# Patient Record
Sex: Female | Born: 1980 | Race: White | Hispanic: Yes | Marital: Married | Smoking: Never smoker
Health system: Southern US, Community
[De-identification: ages and names within clinical notes are randomized; demographics above are authoritative.]

## PROBLEM LIST (undated history)

## (undated) DIAGNOSIS — F32A Depression, unspecified: Secondary | ICD-10-CM

## (undated) DIAGNOSIS — F419 Anxiety disorder, unspecified: Secondary | ICD-10-CM

## (undated) HISTORY — PX: ECTOPIC PREGNANCY SURGERY: SHX613

## (undated) HISTORY — PX: OTHER SURGICAL HISTORY: SHX169

## (undated) HISTORY — PX: APPENDECTOMY: SHX54

## (undated) HISTORY — PX: TUBAL LIGATION: SHX77

## (undated) HISTORY — PX: VAGINAL HYSTERECTOMY: SUR661

---

## 1999-04-16 ENCOUNTER — Other Ambulatory Visit: Admission: RE | Admit: 1999-04-16 | Discharge: 1999-04-16 | Payer: Self-pay | Admitting: Family Medicine

## 2001-09-15 ENCOUNTER — Other Ambulatory Visit: Admission: RE | Admit: 2001-09-15 | Discharge: 2001-09-15 | Payer: Self-pay | Admitting: Family Medicine

## 2001-12-08 ENCOUNTER — Other Ambulatory Visit: Admission: RE | Admit: 2001-12-08 | Discharge: 2001-12-08 | Payer: Self-pay | Admitting: Family Medicine

## 2005-03-27 ENCOUNTER — Other Ambulatory Visit: Admission: RE | Admit: 2005-03-27 | Discharge: 2005-03-27 | Payer: Self-pay | Admitting: Family Medicine

## 2005-03-27 ENCOUNTER — Ambulatory Visit: Payer: Self-pay | Admitting: Family Medicine

## 2015-09-19 ENCOUNTER — Emergency Department (HOSPITAL_COMMUNITY)
Admission: EM | Admit: 2015-09-19 | Discharge: 2015-09-19 | Disposition: A | Discharge status: Home / Self Care | Payer: BC Managed Care – PPO

## 2015-09-19 NOTE — ED Notes (Signed)
Pt does not want to wait.

## 2018-09-07 ENCOUNTER — Other Ambulatory Visit: Payer: Self-pay

## 2018-09-07 ENCOUNTER — Encounter: Payer: Self-pay | Admitting: Obstetrics & Gynecology

## 2018-09-07 ENCOUNTER — Ambulatory Visit: Payer: BC Managed Care – PPO | Admitting: Obstetrics & Gynecology

## 2018-09-07 VITALS — BP 126/74 | HR 76 | Ht 63.0 in | Wt 194.0 lb

## 2018-09-07 DIAGNOSIS — N920 Excessive and frequent menstruation with regular cycle: Secondary | ICD-10-CM | POA: Diagnosis not present

## 2018-09-07 DIAGNOSIS — N946 Dysmenorrhea, unspecified: Secondary | ICD-10-CM

## 2018-09-07 MED ORDER — MEGESTROL ACETATE 40 MG PO TABS: ORAL_TABLET | ORAL | 3 refills | Status: DC

## 2018-09-07 NOTE — Progress Notes (Signed)
Chief Complaint  Patient presents with  . Menorrhagia    wants ablation      38 y.o. [name] Patient's last menstrual period was 08/20/2018 (exact date). The current method of family planning is none.  Outpatient Encounter Medications as of 09/07/2018  Medication Sig  . ibuprofen (ADVIL,MOTRIN) 800 MG tablet   . sertraline (ZOLOFT) 50 MG tablet TAKE 1 & 1 2 (ONE & ONE HALF) TABLETS BY MOUTH ONCE DAILY  . valACYclovir (VALTREX) 1000 MG tablet Take 1,000 mg by mouth 3 (three) times daily.  . megestrol (MEGACE) 40 MG tablet 3 tablets a day for 5 days, 2 tablets a day for 5 days then 1 tablet daily   No facility-administered encounter medications on file as of 09/07/2018.     Subjective Pt has worsening menstrual periods over the past 2-3 years Last 5-7 days very heavy with clots and cramping Uses a whole box of super tampons per cycle +soils Hemoglobin 12.3 No dyspareunia and no pain at times other than with her cycle History reviewed. No pertinent past medical history.  Past Surgical History:  Procedure Laterality Date  . APPENDECTOMY    . CESAREAN SECTION WITH BILATERAL TUBAL LIGATION    . ECTOPIC PREGNANCY SURGERY    . reversal of tubal      OB History    Gravida  5   Para  4   Term  4   Preterm      AB  1   Living  4     SAB      TAB      Ectopic  1   Multiple      Live Births              No Known Allergies  Social History   Socioeconomic History  . Marital status: Married    Spouse name: Not on file  . Number of children: Not on file  . Years of education: Not on file  . Highest education level: Not on file  Occupational History  . Not on file  Social Needs  . Financial resource strain: Not on file  . Food insecurity:    Worry: Not on file    Inability: Not on file  . Transportation needs:    Medical: Not on file    Non-medical: Not on file  Tobacco Use  . Smoking status: Never Smoker  . Smokeless tobacco: Never Used    Substance and Sexual Activity  . Alcohol use: Not Currently  . Drug use: Not Currently  . Sexual activity: Yes    Birth control/protection: None  Lifestyle  . Physical activity:    Days per week: Not on file    Minutes per session: Not on file  . Stress: Not on file  Relationships  . Social connections:    Talks on phone: Not on file    Gets together: Not on file    Attends religious service: Not on file    Active member of club or organization: Not on file    Attends meetings of clubs or organizations: Not on file    Relationship status: Not on file  Other Topics Concern  . Not on file  Social History Narrative  . Not on file    History reviewed. No pertinent family history.  Medications:       Current Outpatient Medications:  .  ibuprofen (ADVIL,MOTRIN) 800 MG tablet, , Disp: , Rfl:  .  sertraline (ZOLOFT)  50 MG tablet, TAKE 1 & 1 2 (ONE & ONE HALF) TABLETS BY MOUTH ONCE DAILY, Disp: , Rfl:  .  valACYclovir (VALTREX) 1000 MG tablet, Take 1,000 mg by mouth 3 (three) times daily., Disp: , Rfl:  .  megestrol (MEGACE) 40 MG tablet, 3 tablets a day for 5 days, 2 tablets a day for 5 days then 1 tablet daily, Disp: 45 tablet, Rfl: 3  Objective Blood pressure 126/74, pulse 76, height 5\' 3"  (1.6 m), weight 194 lb (88 kg), last menstrual period 08/20/2018.  Gen WDWN NAD  Pertinent ROS No burning with urination, frequency or urgency No nausea, vomiting or diarrhea Nor fever chills or other constitutional symptoms   Labs or studies Hemoglobin 12.3    Impression Diagnoses this Encounter::   ICD-10-CM   1. Menorrhagia with regular cycle N92.0 US PELVIS (TRANSABDOMINAL ONLY)    US Transvaginal Non-OB  2. Dysmenorrhea N94.6 US PELVIS (TRANSABDOMINAL ONLY)    US Transvaginal Non-OB    Established relevant diagnosis(es):   Plan/Recommendations: Meds ordered this encounter  Medications  . megestrol (MEGACE) 40 MG tablet    Sig: 3 tablets a day for 5 days, 2 tablets a  day for 5 days then 1 tablet daily    Dispense:  45 tablet    Refill:  3    Labs or Scans Ordered: Orders Placed This Encounter  Procedures  . US PELVIS (TRANSABDOMINAL ONLY)  . US Transvaginal Non-OB    Management:: >megestrol trial for endometrial suppression >sonogram 1 month  Follow up Return in about 1 month (around 10/08/2018) for GYN sono, Follow up, with Dr Despina Hidden.        Face to face time:  20 minutes  Greater than 50% of the visit time was spent in counseling and coordination of care with the patient.  The summary and outline of the counseling and care coordination is summarized in the note above.   All questions were answered.

## 2018-10-08 ENCOUNTER — Other Ambulatory Visit: Payer: BC Managed Care – PPO

## 2020-02-07 ENCOUNTER — Other Ambulatory Visit: Payer: Self-pay

## 2020-02-07 ENCOUNTER — Ambulatory Visit
Admission: EM | Admit: 2020-02-07 | Discharge: 2020-02-07 | Disposition: A | Discharge status: Home / Self Care | Payer: Medicaid Other | Attending: Emergency Medicine | Admitting: Emergency Medicine

## 2020-02-07 ENCOUNTER — Ambulatory Visit (INDEPENDENT_AMBULATORY_CARE_PROVIDER_SITE_OTHER): Payer: Medicaid Other

## 2020-02-07 DIAGNOSIS — M25521 Pain in right elbow: Secondary | ICD-10-CM

## 2020-02-07 DIAGNOSIS — M778 Other enthesopathies, not elsewhere classified: Secondary | ICD-10-CM

## 2020-02-07 DIAGNOSIS — M5410 Radiculopathy, site unspecified: Secondary | ICD-10-CM | POA: Diagnosis not present

## 2020-02-07 DIAGNOSIS — K644 Residual hemorrhoidal skin tags: Secondary | ICD-10-CM | POA: Diagnosis not present

## 2020-02-07 DIAGNOSIS — M67824 Other specified disorders of tendon, left elbow: Secondary | ICD-10-CM

## 2020-02-07 MED ORDER — HYDROCORTISONE (PERIANAL) 2.5 % EX CREA: 1.0000 "application " | TOPICAL_CREAM | Freq: Two times a day (BID) | CUTANEOUS | 0 refills | Status: DC

## 2020-02-07 MED ORDER — BENZOCAINE 20 % RE OINT: TOPICAL_OINTMENT | RECTAL | 0 refills | Status: DC | PRN

## 2020-02-07 MED ORDER — DEXAMETHASONE SODIUM PHOSPHATE 10 MG/ML IJ SOLN
10.0000 mg | Freq: Once | INTRAMUSCULAR | Status: AC
  Administered 2020-02-07: 10 mg via INTRAMUSCULAR

## 2020-02-07 MED ORDER — GABAPENTIN 100 MG PO CAPS: 100.0000 mg | ORAL_CAPSULE | Freq: Three times a day (TID) | ORAL | 0 refills | Status: DC

## 2020-02-07 NOTE — Discharge Instructions (Addendum)
Gabapentin was prescribed for cervical radiculopathy Decadron IM was given for both tendinitis Anusol and  benzocaine were prescribed for hemorrhoid Follow RICE instruction that is attached Follow-up with PCP Return or go to ED for worsening symptoms

## 2020-02-07 NOTE — ED Provider Notes (Signed)
[facility]   [id number] 02/07/20 Arrival Time: 1621   Chief Complaint  Patient presents with   Joint Swelling   Hemorrhoids     SUBJECTIVE: History from: patient.  Patricia Patel is a 39 y.o. female who presented to the urgent care for complaint of right elbow pain for the past month.  Report tingling and numbness on the tip of finger. Denies any precipitating event, injury or trauma.  Localized pain to the right elbow.  She describes the pain as constant and achy.  She has tried OTC medications without relief.  Her symptoms are made worse with ROM.  She denies similar symptoms in the past.  Denies chills, fever, nausea, vomiting, diarrhea.  She is also complaining of hemorrhoid for the past few weeks.  Has tried OTC medication without relief.  Reports similar symptoms in the past that improved with benzocaine and Anusol.  Denies alleviating or aggravating factor.   ROS: As per HPI.  All other pertinent ROS negative.      No past medical history on file. Past Surgical History:  Procedure Laterality Date   APPENDECTOMY     CESAREAN SECTION WITH BILATERAL TUBAL LIGATION     ECTOPIC PREGNANCY SURGERY     reversal of tubal     No Known Allergies No current facility-administered medications on file prior to encounter.   Current Outpatient Medications on File Prior to Encounter  Medication Sig Dispense Refill   ibuprofen (ADVIL,MOTRIN) 800 MG tablet      megestrol (MEGACE) 40 MG tablet 3 tablets a day for 5 days, 2 tablets a day for 5 days then 1 tablet daily 45 tablet 3   sertraline (ZOLOFT) 50 MG tablet TAKE 1 & 1 2 (ONE & ONE HALF) TABLETS BY MOUTH ONCE DAILY     valACYclovir (VALTREX) 1000 MG tablet Take 1,000 mg by mouth 3 (three) times daily.     Social History   Socioeconomic History   Marital status: Married    Spouse name: Not on file   Number of children: Not on file   Years of education: Not on file   Highest education level: Not on file   Occupational History   Not on file  Tobacco Use   Smoking status: Never Smoker   Smokeless tobacco: Never Used  Vaping Use   Vaping Use: Never used  Substance and Sexual Activity   Alcohol use: Not Currently   Drug use: Not Currently   Sexual activity: Yes    Birth control/protection: None  Other Topics Concern   Not on file  Social History Narrative   Not on file   Social Determinants of Health   Financial Resource Strain:    Difficulty of Paying Living Expenses:   Food Insecurity:    Worried About Programme researcher, broadcasting/film/video in the Last Year:    Barista in the Last Year:   Transportation Needs:    Freight forwarder (Medical):    Lack of Transportation (Non-Medical):   Physical Activity:    Days of Exercise per Week:    Minutes of Exercise per Session:   Stress:    Feeling of Stress :   Social Connections:    Frequency of Communication with Friends and Family:    Frequency of Social Gatherings with Friends and Family:    Attends Religious Services:    Active Member of Clubs or Organizations:    Attends Banker Meetings:    Marital Status:  Intimate Partner Violence:    Fear of Current or Ex-Partner:    Emotionally Abused:    Physically Abused:    Sexually Abused:    No family history on file.  OBJECTIVE:  Vitals:   02/07/20 1711  BP: 118/77  Pulse: 78  Resp: 18  Temp: 97.9 F (36.6 C)  SpO2: 96%     Physical Exam Vitals and nursing note reviewed.  Constitutional:      General: She is not in acute distress.    Appearance: Normal appearance. She is normal weight. She is not ill-appearing, toxic-appearing or diaphoretic.  HENT:     Head: Normocephalic.  Cardiovascular:     Rate and Rhythm: Normal rate and regular rhythm.     Pulses: Normal pulses.     Heart sounds: Normal heart sounds. No murmur heard.  No friction rub. No gallop.   Pulmonary:     Effort: Pulmonary effort is normal. No respiratory  distress.     Breath sounds: Normal breath sounds. No stridor. No wheezing, rhonchi or rales.  Chest:     Chest wall: No tenderness.  Musculoskeletal:     Right elbow: Tenderness present.     Left elbow: Normal.     Comments: The right elbow is without obvious asymmetry or deformity compared to the left elbow.  No obvious surface trauma, ecchymosis, soft tissue swelling.  Bony tenderness to lateral epicondyle.  Normal muscle strength.  Neurovascular status intact.  Neurological:     Mental Status: She is alert and oriented to person, place, and time.     GCS: GCS eye subscore is 4. GCS verbal subscore is 5. GCS motor subscore is 6.     Cranial Nerves: Cranial nerves are intact.     Sensory: Sensory deficit present.     Motor: Motor function is intact.     Coordination: Coordination is intact.     Gait: Gait is intact.     LABS:  No results found for this or any previous visit (from the past 24 hour(s)).   Radiology DG Elbow Complete Right  Result Date: 02/07/2020 CLINICAL DATA:  Right elbow pain EXAM: RIGHT ELBOW - COMPLETE 3+ VIEW COMPARISON:  None. FINDINGS: There is no evidence of fracture, dislocation, or joint effusion. There is no evidence of arthropathy or other focal bone abnormality. Soft tissues are unremarkable. IMPRESSION: Negative. Electronically Signed   By: Charlett Nose M.D.   On: 02/07/2020 18:18   ASSESSMENT & PLAN:  1. Right elbow tendinitis   2. Hemorrhoids, external   3. Radiculopathy affecting upper extremity     No orders of the defined types were placed in this encounter.  Patient stable for discharge. Right elbow x-ray is negative for bony abnormality including fracture or dislocation.  I have reviewed the x-ray myself and the radiologist interpretation.  I am in agreement with the radiologist interpretation.  Was advised to take OTC Tylenol/ibuprofen as needed for pain.  Decadron IM was given in office.  Declines oral steroid.  Discharge  instructions Gabapentin was prescribed for cervical radiculopathy Decadron IM was given for both tendinitis Anusol and  benzocaine were prescribed for hemorrhoid Follow RICE instruction that is attached Follow-up with PCP Return or go to ED for worsening symptoms   Reviewed expectations re: course of current medical issues. Questions answered. Outlined signs and symptoms indicating need for more acute intervention. Patient verbalized understanding. After Visit Summary given.      Note: This document was prepared using Dragon voice  recognition software and may include unintentional dictation errors.    Durward Parcel, FNP 02/07/20 1842

## 2020-02-07 NOTE — ED Triage Notes (Signed)
Pt presents with right shoulder pain that developed a month ago, denies injury, pain now in elbow, sharp pain with extension , also reports hemorrhoids

## 2020-10-02 ENCOUNTER — Ambulatory Visit: Payer: Medicaid Other | Admitting: Urology

## 2020-10-02 DIAGNOSIS — N390 Urinary tract infection, site not specified: Secondary | ICD-10-CM

## 2021-09-06 IMAGING — DX DG ELBOW COMPLETE 3+V*R*
4 series · 4 of 4 positions shown · non-contrast
Comparison: None.

CLINICAL DATA: Right elbow pain

EXAM:
RIGHT ELBOW - COMPLETE 3+ VIEW

[elbow ap]
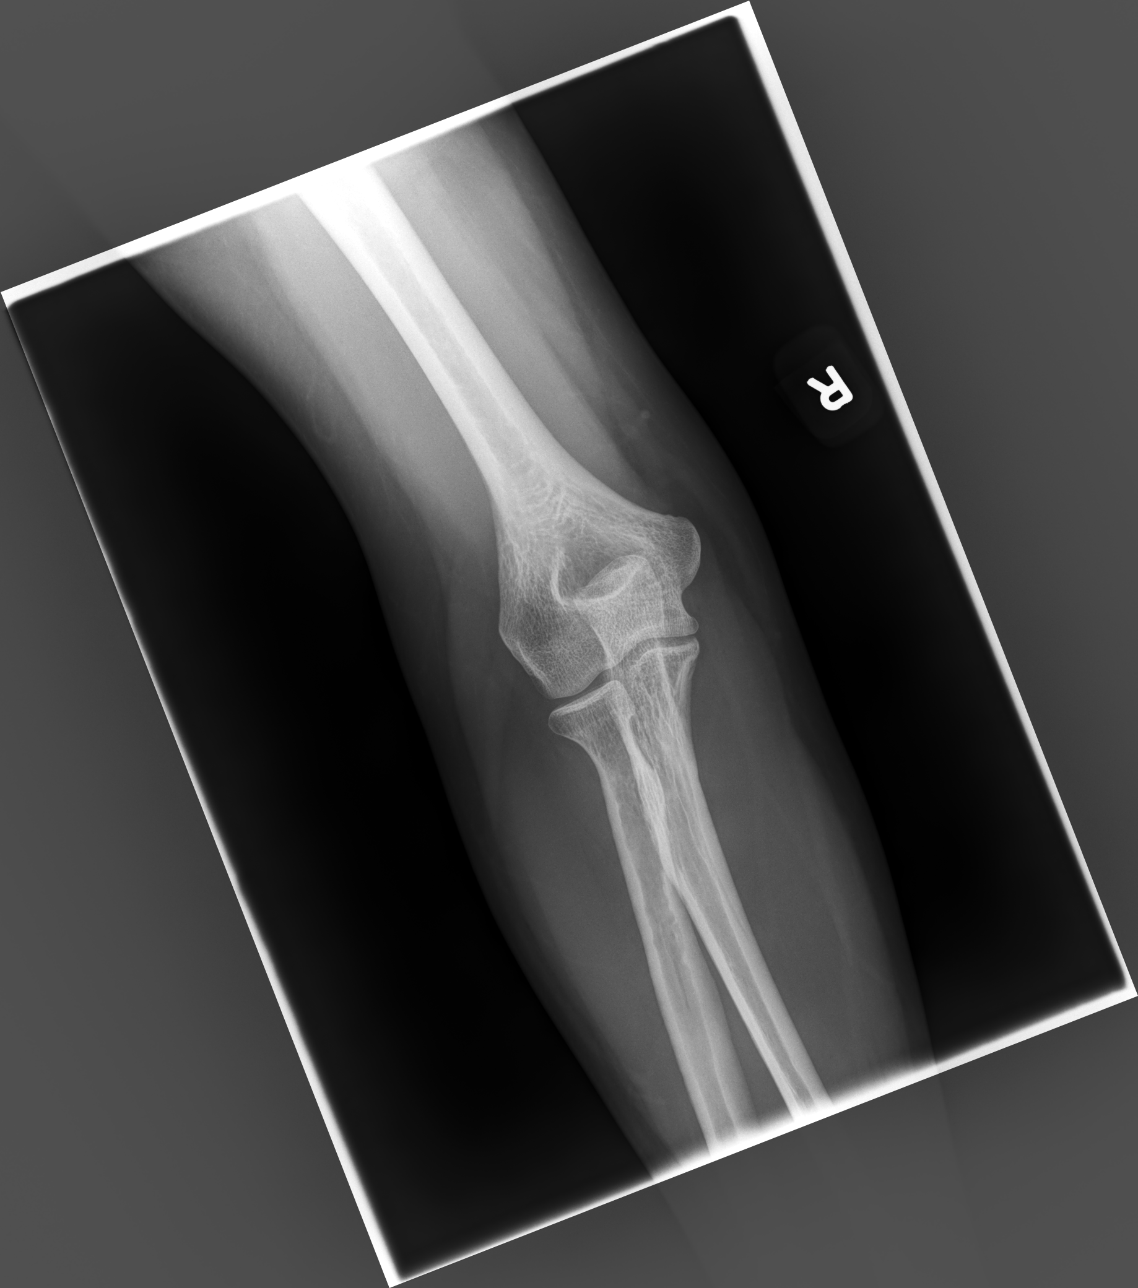

[elbow lmo (1 of 2)]
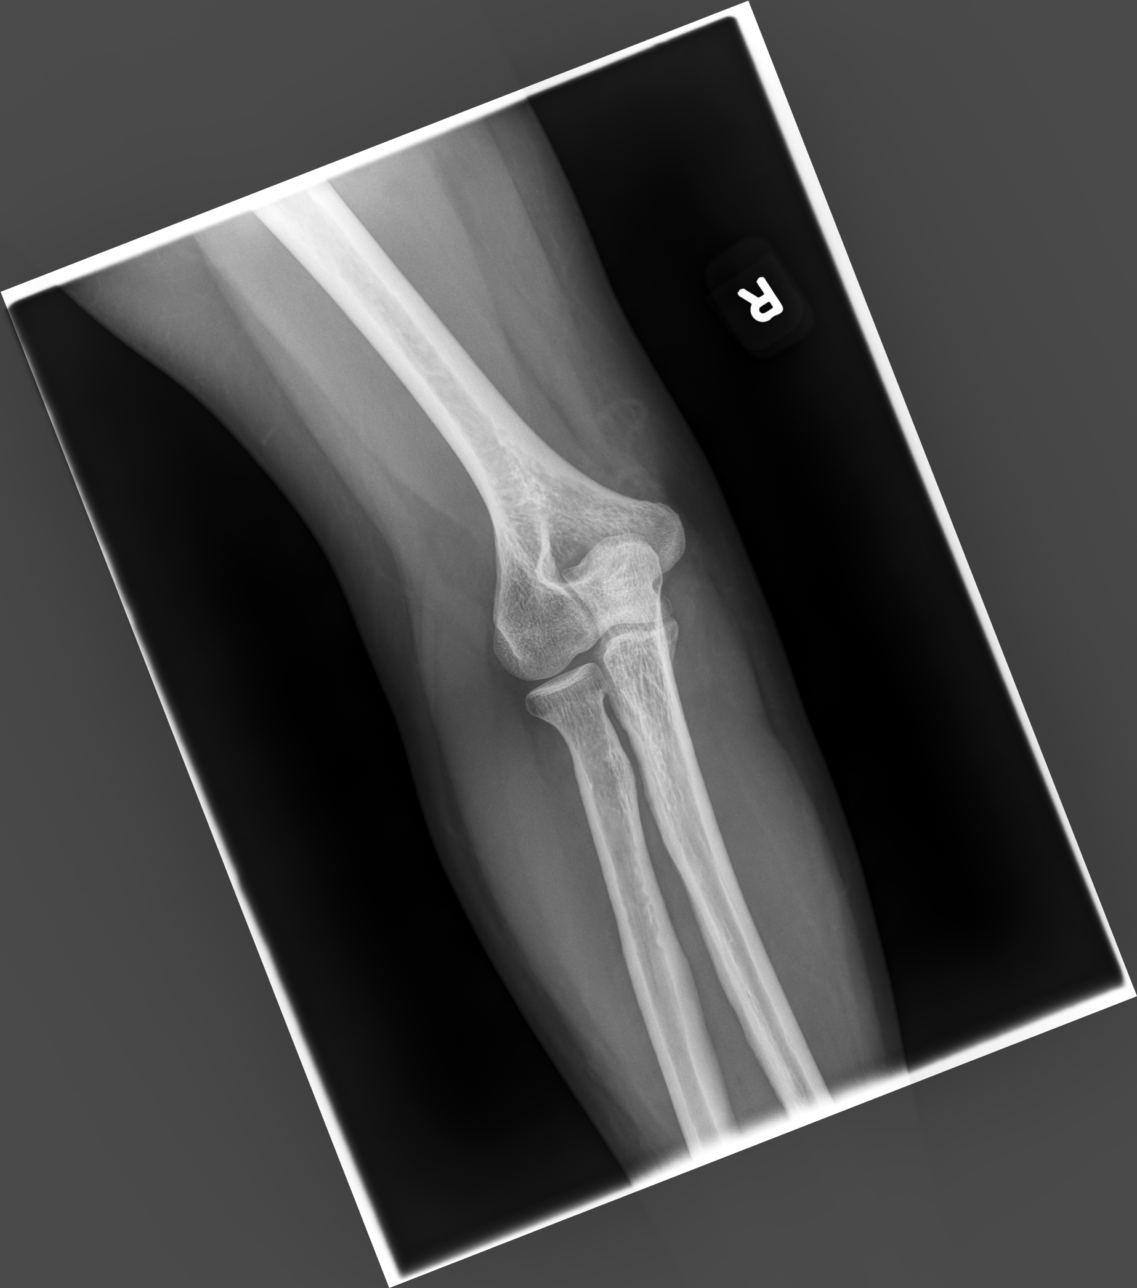

[elbow lmo (2 of 2)]
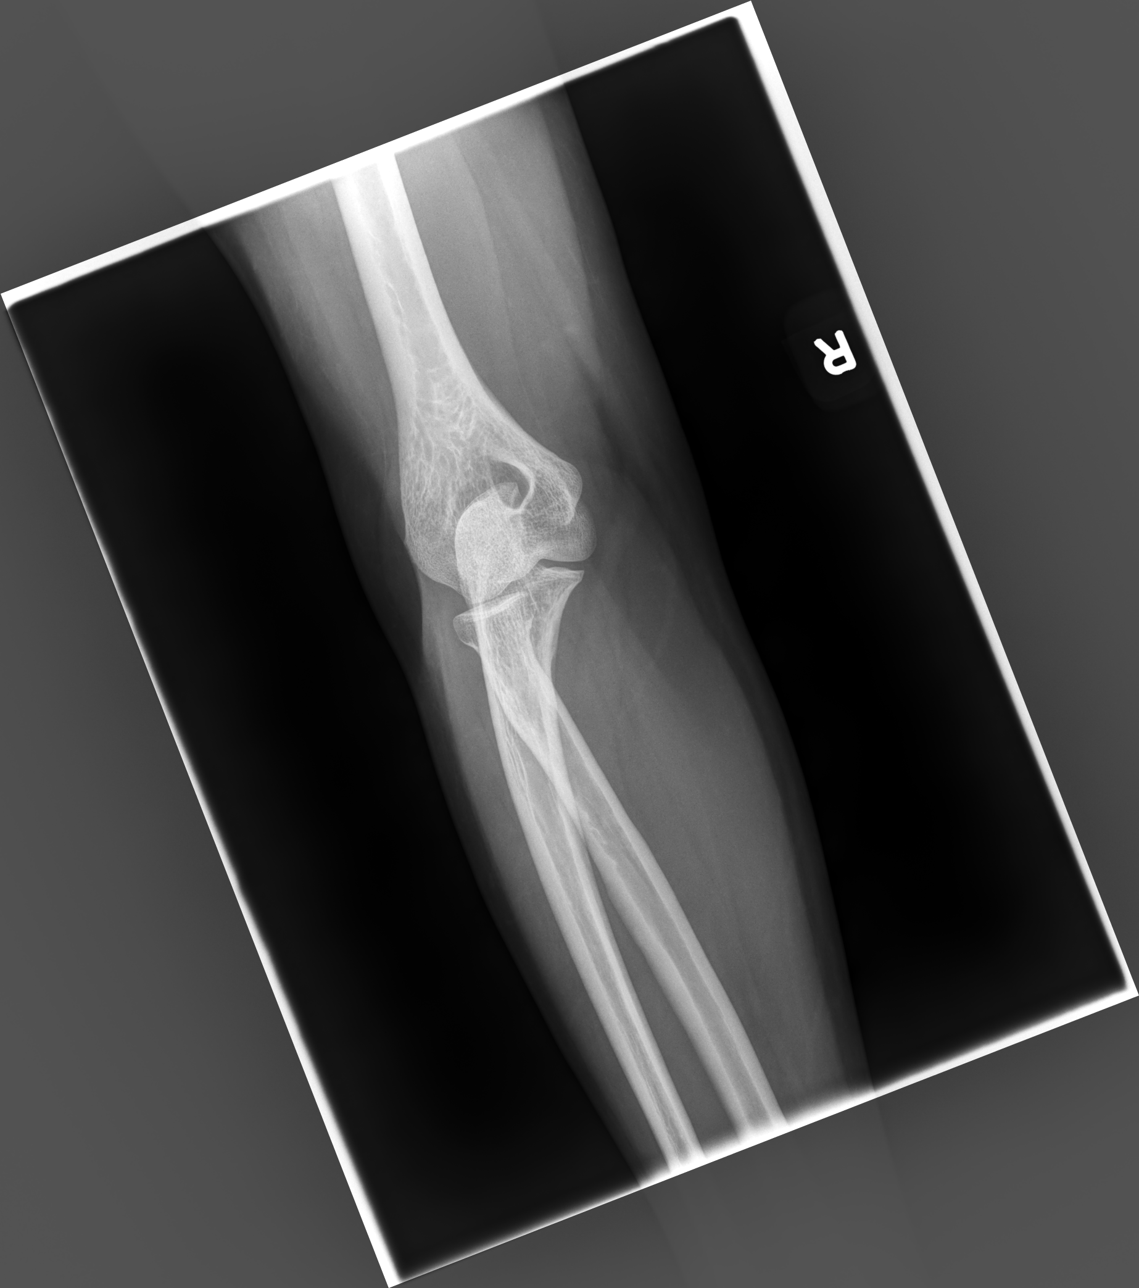

[elbow lat]
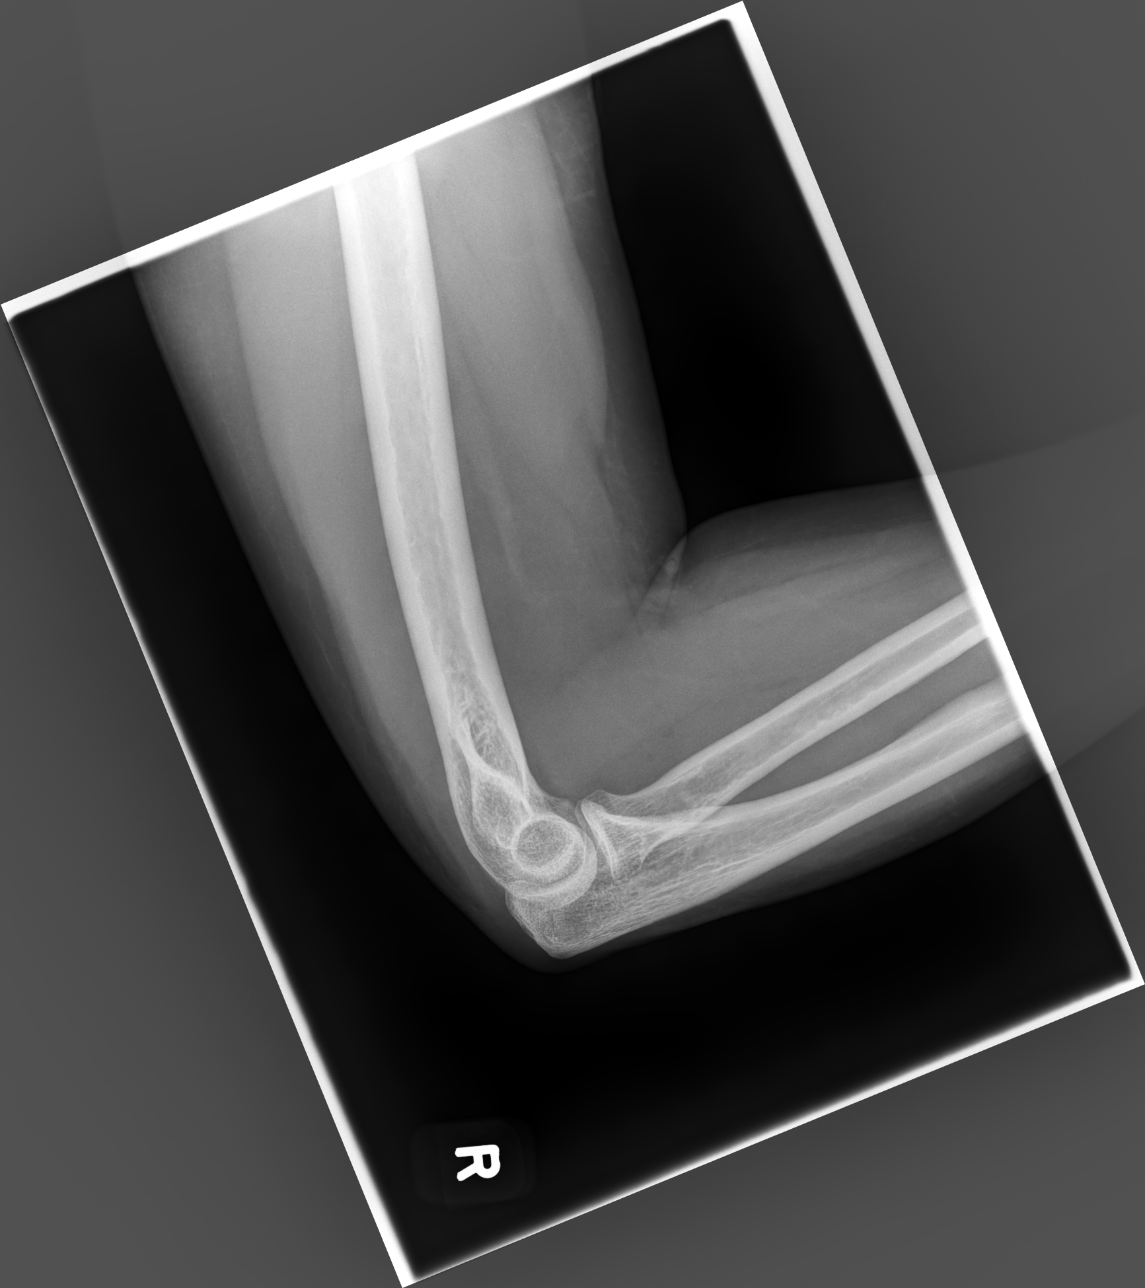

[4 of 4 positions shown; findings below may reference images not displayed]

FINDINGS: There is no evidence of fracture, dislocation, or joint effusion.
There is no evidence of arthropathy or other focal bone abnormality.
Soft tissues are unremarkable.
IMPRESSION: Negative.

## 2021-10-02 ENCOUNTER — Encounter: Payer: Medicaid Other | Admitting: Nurse Practitioner

## 2021-10-02 NOTE — Progress Notes (Signed)
Erroneous encounter- appointment was actually for her son ?

## 2021-12-05 ENCOUNTER — Other Ambulatory Visit: Payer: Self-pay

## 2021-12-06 ENCOUNTER — Ambulatory Visit: Payer: Medicaid Other | Admitting: Physician Assistant

## 2021-12-07 ENCOUNTER — Ambulatory Visit: Payer: Medicaid Other | Admitting: Physician Assistant

## 2021-12-12 ENCOUNTER — Ambulatory Visit (INDEPENDENT_AMBULATORY_CARE_PROVIDER_SITE_OTHER): Payer: BC Managed Care – PPO | Admitting: Physician Assistant

## 2021-12-12 VITALS — BP 115/66 | HR 90 | Ht 63.0 in | Wt 198.0 lb

## 2021-12-12 DIAGNOSIS — N390 Urinary tract infection, site not specified: Secondary | ICD-10-CM

## 2021-12-12 DIAGNOSIS — N3281 Overactive bladder: Secondary | ICD-10-CM | POA: Diagnosis not present

## 2021-12-12 DIAGNOSIS — N8111 Cystocele, midline: Secondary | ICD-10-CM

## 2021-12-12 DIAGNOSIS — R3129 Other microscopic hematuria: Secondary | ICD-10-CM

## 2021-12-12 DIAGNOSIS — N393 Stress incontinence (female) (male): Secondary | ICD-10-CM

## 2021-12-12 LAB — BLADDER SCAN AMB NON-IMAGING: Scan Result: 35

## 2021-12-12 MED ORDER — MIRABEGRON ER 25 MG PO TB24: 25.0000 mg | ORAL_TABLET | Freq: Every day | ORAL | 0 refills | Status: DC

## 2021-12-12 NOTE — Progress Notes (Signed)
12/12/2021 2:51 PM   [place] Lamaster Jun 02, 1981 [id number]   Assessment:  1. Frequent UTI - Urinalysis, Routine w reflex microscopic - BLADDER SCAN AMB NON-IMAGING  2. Cystocele, midline  3. Microscopic hematuria  4. OAB (overactive bladder)  5. SUI (stress urinary incontinence, female)    Plan: Samples of Myrbetriq 25 mg given and patient advised to take once daily.  Following review of medical records which been requested, patient is advised that CT hematuria study and cystoscopic exam may be indicated.  She will follow-up in 4 to 6 weeks for urinalysis and further evaluation.  Chief Complaint: No chief complaint on file.   Referring provider: Practice, [facility] [address],  [place] [address]   History of Present Illness:  Patricia Patel is a 41 y.o. year old female who is seen in consultation from Practice, [facility] for evaluation of frequent and recurrent UTIs.  Patient also reports a long history of stress urinary incontinence without urge symptoms, she has been diagnosed with cystocele in the past and has also been told that she has microscopic blood in her urine.  Patient states she was evaluated with [facility] urology "years ago" and was unhappy that she sought no relief in her symptoms.  She then went to [facility] urology in [place] where she was diagnosed with a cystocele and microscopic hematuria with recurrent UTIs. Logan Bores was prescribed and was not helpful for her.  She reports she has also been prescribed trimethoprim for postcoital use to reduce frequency of UTIs, the patient does not feel this is made a difference.  Patient reports 6-7 UTIs within the past year and states she has had positive cultures.The only medical records available from her primary care provider reveal urine culture obtained on 11/28/2021.  The culture grew fewer than 10,000 colonies of mixed urogenital flora. Culture noted in the epic system on 09/22/2020 was positive for E.  coli, greater than 100,000 colonies, pansensitive.  No other medical records available for review today.  Medical history is otherwise significant for hysterectomy in 2022.  She admits to chronic issues with constipation.  The patient drinks 2 caffeinated beverages per day and denies tobacco use.  Currently, patient conveys frustration that her symptoms and frequency of UTIs persist despite evaluation and OAB meds.  Today, she complains of urinary frequency, urgency, nocturia.  No gross hematuria.  No history of stone disease.  No hematuria evaluation in the past  We will obtain medical records from [facility] urology and alliance to assess whether hematuria work-up has been completed. UA = 6-10 WBCs, 3-10 RBCs, no bacteria, nitrite negative PVR = 35 mL  Portions of the above documentation were copied from a prior visit for review purposes only  Past Medical History:  No past medical history on file.  Past Surgical History:  Past Surgical History:  Procedure Laterality Date   APPENDECTOMY     CESAREAN SECTION WITH BILATERAL TUBAL LIGATION     ECTOPIC PREGNANCY SURGERY     reversal of tubal      Allergies:  No Known Allergies  Family History:  No family history on file.  Social History:  Social History   Tobacco Use   Smoking status: Never   Smokeless tobacco: Never  Vaping Use   Vaping Use: Never used  Substance Use Topics   Alcohol use: Not Currently   Drug use: Not Currently    Review of symptoms:  Constitutional:  Negative for unexplained weight loss, night sweats, fever, chills ENT:  Negative for nose bleeds, sinus pain, painful swallowing CV:  Negative for chest pain, shortness of breath, exercise intolerance, palpitations, loss of consciousness Resp:  Negative for cough, wheezing, shortness of breath GI:  Negative for nausea, vomiting, diarrhea, bloody stools GU:  Positives noted in HPI; otherwise negative for gross hematuria, dysuria Neuro:  Negative for seizures, poor  balance, limb weakness, slurred speech Psych:  Negative for lack of energy, depression, anxiety Endocrine:  Negative for polydipsia, polyuria, symptoms of hypoglycemia (dizziness, hunger, sweating) Hematologic:  Negative for anemia, purpura, petechia, prolonged or excessive bleeding, use of anticoagulants   Physical Exam: BP 115/66   Pulse 90   Ht 5\' 3"  (1.6 m)   Wt 198 lb (89.8 kg)   BMI 35.07 kg/m   Constitutional:  Alert and oriented, No acute distress. HEENT: NCAT, moist mucus membranes.  Trachea midline, no masses. Cardiovascular: Regular rate and rhythm without murmur, rub, or gallops No clubbing, cyanosis, or edema. Respiratory: Normal respiratory effort, clear to auscultation bilaterally GI: Abdomen is soft, nontender, nondistended, no abdominal masses BACK:  Non-tender to palpation.  No CVAT Lymph: No cervical or inguinal lymphadenopathy. Skin: No obvious rashes, warm, dry, intact Neurologic: Alert and oriented, Cranial nerves grossly intact, no focal deficits, moving all 4 extremities. Psychiatric: Appropriate. Normal mood and affect.  Laboratory Data: Results for orders placed or performed in visit on 12/12/21 (from the past 24 hour(s))  BLADDER SCAN AMB NON-IMAGING   Collection Time: 12/12/21  2:46 PM  Result Value Ref Range   Scan Result 35     No results found for: "WBC", "HGB", "HCT", "MCV", "PLT"  No results found for: "CREATININE"  No results found for: "HGBA1C"  Urinalysis No results found for: "COLORURINE", "APPEARANCEUR", "LABSPEC", "PHURINE", "GLUCOSEU", "HGBUR", "BILIRUBINUR", "KETONESUR", "PROTEINUR", "UROBILINOGEN", "NITRITE", "LEUKOCYTESUR"  No results found for: "LABMICR", "WBCUA", "RBCUA", "LABEPIT", "MUCUS", "BACTERIA"  Pertinent Imaging: No results found for this or any previous visit.  No results found for this or any previous visit.     [place], Berneice Heinrich, PA-C [facility] [place]

## 2021-12-12 NOTE — Progress Notes (Signed)
post void residual =35mL 

## 2021-12-13 LAB — URINALYSIS, ROUTINE W REFLEX MICROSCOPIC
Bilirubin, UA: NEGATIVE
Glucose, UA: NEGATIVE
Ketones, UA: NEGATIVE
Nitrite, UA: NEGATIVE
Protein,UA: NEGATIVE
Specific Gravity, UA: 1.01 (ref 1.005–1.030)
Urobilinogen, Ur: 0.2 mg/dL (ref 0.2–1.0)
pH, UA: 5.5 (ref 5.0–7.5)

## 2021-12-13 LAB — MICROSCOPIC EXAMINATION: Bacteria, UA: NONE SEEN

## 2021-12-17 ENCOUNTER — Other Ambulatory Visit: Payer: Self-pay

## 2021-12-27 ENCOUNTER — Ambulatory Visit (INDEPENDENT_AMBULATORY_CARE_PROVIDER_SITE_OTHER): Payer: BC Managed Care – PPO | Admitting: Physician Assistant

## 2021-12-27 VITALS — BP 110/69 | HR 80

## 2021-12-27 DIAGNOSIS — R300 Dysuria: Secondary | ICD-10-CM

## 2021-12-27 DIAGNOSIS — N390 Urinary tract infection, site not specified: Secondary | ICD-10-CM | POA: Diagnosis not present

## 2021-12-27 LAB — URINALYSIS, ROUTINE W REFLEX MICROSCOPIC
Bilirubin, UA: NEGATIVE
Glucose, UA: NEGATIVE
Nitrite, UA: NEGATIVE
Protein,UA: NEGATIVE
Specific Gravity, UA: 1.025 (ref 1.005–1.030)
Urobilinogen, Ur: 1 mg/dL (ref 0.2–1.0)
pH, UA: 6 (ref 5.0–7.5)

## 2021-12-27 LAB — MICROSCOPIC EXAMINATION: Renal Epithel, UA: NONE SEEN /hpf

## 2021-12-27 MED ORDER — NITROFURANTOIN MONOHYD MACRO 100 MG PO CAPS: 100.0000 mg | ORAL_CAPSULE | Freq: Two times a day (BID) | ORAL | 0 refills | Status: DC

## 2021-12-27 NOTE — Progress Notes (Signed)
Assessment: 1. Frequent UTI - Urinalysis, Routine w reflex microscopic - Urine Culture  2. Dysuria - Urine Culture    Plan: Macrobid Rx and cx ordered.  Will adjust therapy if indicated by results.  Keep FU as scheduled for UA and further eval of frequent UTI sxs. Medical records from [facility] health in [place] where patient states she is seen urology in the past were extensive and primarily related to a previous lumbar injury which involved attorney records and back pain evaluation.  Operative notes from vaginal hysterectomy performed on 01/23/2021 also noted.  No urinalysis or urine culture results seen in these records.  Chief Complaint: No chief complaint on file.   HPI: Patricia Patel is a 41 y.o. female who presents for evaluation of acute onset dysuria, burning, urgency, and foul odor of urine. No fever, chills, NV. No abominal pain. No gross hematuria. She states OAB sxs have been much better on Myrbetriq 25 daily with much less frequency and urgency.  UA = 11-30 WBCs, 11-30 RBCs, few bacteria, nitrite negative  12/12/21 Patricia Patel is a 41 y.o. year old female who is seen in consultation from Practice, [facility] for evaluation of frequent and recurrent UTIs.  Patient also reports a long history of stress urinary incontinence without urge symptoms, she has been diagnosed with cystocele in the past and has also been told that she has microscopic blood in her urine.  Patient states she was evaluated with [facility] urology "years ago" and was unhappy that she sought no relief in her symptoms.  She then went to [facility] urology in [place] where she was diagnosed with a cystocele and microscopic hematuria with recurrent UTIs. Leslye Peer was prescribed and was not helpful for her.  She reports she has also been prescribed trimethoprim for postcoital use to reduce frequency of UTIs, the patient does not feel this is made a difference.  Patient reports 6-7 UTIs within the past year and states  she has had positive cultures.The only medical records available from her primary care provider reveal urine culture obtained on 11/28/2021.  The culture grew fewer than 10,000 colonies of mixed urogenital flora. Culture noted in the epic system on 09/22/2020 was positive for E. coli, greater than 100,000 colonies, pansensitive.  No other medical records available for review today.  Medical history is otherwise significant for hysterectomy in 2022.  She admits to chronic issues with constipation.  The patient drinks 2 caffeinated beverages per day and denies tobacco use.  Currently, patient conveys frustration that her symptoms and frequency of UTIs persist despite evaluation and OAB meds.  Today, she complains of urinary frequency, urgency, nocturia.  No gross hematuria.  No history of stone disease.  No hematuria evaluation in the past  We will obtain medical records from [facility]rology and [facility] to assess whether hematuria work-up has been completed. UA = 6-10 WBCs, 3-10 RBCs, no bacteria, nitrite negative PVR = 35 mL   Portions of the above documentation were copied from a prior visit for review purposes only.  Allergies: No Known Allergies  PMH: No past medical history on file.  PSH: Past Surgical History:  Procedure Laterality Date   APPENDECTOMY     CESAREAN SECTION WITH BILATERAL TUBAL LIGATION     ECTOPIC PREGNANCY SURGERY     reversal of tubal      SH: Social History   Tobacco Use   Smoking status: Never   Smokeless tobacco: Never  Vaping Use   Vaping Use: Never used  Substance Use Topics   Alcohol use: Not Currently   Drug use: Not Currently    ROS: All other review of systems were reviewed and are negative except what is noted above in HPI  PE: BP 110/69   Pulse 80  GENERAL APPEARANCE:  Well appearing, well developed, well nourished, NAD HEENT:  Atraumatic, normocephalic NECK:  Supple. Trachea midline ABDOMEN:  Soft, non-tender, no masses EXTREMITIES:  Moves  all extremities well NEUROLOGIC:  Alert and oriented x 3 MENTAL STATUS:  appropriate BACK:  Non-tender to palpation, No CVAT SKIN:  Warm, dry, and intact   Results: Laboratory Data:   Urinalysis    Component Value Date/Time   APPEARANCEUR Cloudy (A) 12/12/2021 1514   GLUCOSEU Negative 12/12/2021 1514   BILIRUBINUR Negative 12/12/2021 1514   PROTEINUR Negative 12/12/2021 1514   NITRITE Negative 12/12/2021 1514   LEUKOCYTESUR 1+ (A) 12/12/2021 1514    Lab Results  Component Value Date   LABMICR See below: 12/12/2021   WBCUA 6-10 (A) 12/12/2021   LABEPIT 0-10 12/12/2021   BACTERIA None seen 12/12/2021    Pertinent Imaging: No results found for this or any previous visit.  No results found for this or any previous visit.  No results found for this or any previous visit.  No results found for this or any previous visit.  No results found for this or any previous visit.  No results found for this or any previous visit.  No results found for this or any previous visit.  No results found for this or any previous visit.  No results found for this or any previous visit (from the past 24 hour(s)).

## 2021-12-29 LAB — URINE CULTURE

## 2022-01-07 ENCOUNTER — Other Ambulatory Visit: Payer: BC Managed Care – PPO | Admitting: Urology

## 2022-01-21 ENCOUNTER — Ambulatory Visit (INDEPENDENT_AMBULATORY_CARE_PROVIDER_SITE_OTHER): Payer: BC Managed Care – PPO | Admitting: Urology

## 2022-01-21 VITALS — BP 95/62 | HR 79

## 2022-01-21 DIAGNOSIS — R3129 Other microscopic hematuria: Secondary | ICD-10-CM | POA: Diagnosis not present

## 2022-01-21 LAB — MICROSCOPIC EXAMINATION: Renal Epithel, UA: NONE SEEN /hpf

## 2022-01-21 LAB — URINALYSIS, ROUTINE W REFLEX MICROSCOPIC
Bilirubin, UA: NEGATIVE
Glucose, UA: NEGATIVE
Ketones, UA: NEGATIVE
Nitrite, UA: NEGATIVE
Protein,UA: NEGATIVE
Specific Gravity, UA: 1.015 (ref 1.005–1.030)
Urobilinogen, Ur: 0.2 mg/dL (ref 0.2–1.0)
pH, UA: 7 (ref 5.0–7.5)

## 2022-01-21 MED ORDER — CIPROFLOXACIN HCL 500 MG PO TABS
500.0000 mg | ORAL_TABLET | Freq: Once | ORAL | Status: AC
  Administered 2022-01-21: 500 mg via ORAL

## 2022-01-21 NOTE — Progress Notes (Signed)
   01/21/22  CC: frequent UTI   HPI:  Blood pressure 95/62, pulse 79. NED. A&Ox3.   No respiratory distress   Abd soft, NT, ND Normal external genitalia with patent urethral meatus  Cystoscopy Procedure Note  Patient identification was confirmed, informed consent was obtained, and patient was prepped using Betadine solution.  Lidocaine jelly was administered per urethral meatus.    Procedure: - Flexible cystoscope introduced, without any difficulty.   - Thorough search of the bladder revealed:    normal urethral meatus    normal urothelium    no stones    no ulcers     no tumors    no urethral polyps    no trabeculation  - Ureteral orifices were normal in position and appearance.  Post-Procedure: - Patient tolerated the procedure well  Assessment/ Plan: BMP and CT hematuria protocol Urine for culture   No follow-ups on file.  Wilkie Aye, MD

## 2022-01-22 LAB — BASIC METABOLIC PANEL
BUN/Creatinine Ratio: 13 (ref 9–23)
BUN: 8 mg/dL (ref 6–24)
CO2: 24 mmol/L (ref 20–29)
Calcium: 9.2 mg/dL (ref 8.7–10.2)
Chloride: 104 mmol/L (ref 96–106)
Creatinine, Ser: 0.61 mg/dL (ref 0.57–1.00)
Glucose: 87 mg/dL (ref 70–99)
Potassium: 4.4 mmol/L (ref 3.5–5.2)
Sodium: 141 mmol/L (ref 134–144)
eGFR: 114 mL/min/{1.73_m2} (ref >59)

## 2022-01-24 ENCOUNTER — Other Ambulatory Visit: Payer: Self-pay | Admitting: Physician Assistant

## 2022-01-24 ENCOUNTER — Telehealth: Payer: Self-pay

## 2022-01-24 LAB — URINE CULTURE

## 2022-01-24 MED ORDER — CEPHALEXIN 250 MG PO CAPS: 250.0000 mg | ORAL_CAPSULE | Freq: Four times a day (QID) | ORAL | 0 refills | Status: AC

## 2022-01-24 NOTE — Telephone Encounter (Signed)
-----   Message from [facility] [place], [place] sent at 01/24/2022  1:19 PM EDT ----- Please let pt know her cx indicates mild infection with no resistance to antibx. Pt needs to start Keflex QID for 7 days. Rx sent to [facility] in [place] ----- Message ----- From: Ferdinand Lango, RN Sent: 01/24/2022  11:57 AM EDT To: Regan Rakers [place], PA-C  No treatment started

## 2022-01-24 NOTE — Telephone Encounter (Signed)
Patient aware of results and rx at pharmacy. 

## 2022-01-24 NOTE — Progress Notes (Signed)
Cx indicates pan sensitive E Coli- 50-100K colonies. Keflex sent to pharmacy

## 2022-01-28 ENCOUNTER — Encounter: Payer: Self-pay | Admitting: Urology

## 2022-01-28 NOTE — Patient Instructions (Signed)
Urinary Tract Infection, Adult  A urinary tract infection (UTI) is an infection of any part of the urinary tract. The urinary tract includes the kidneys, ureters, bladder, and urethra. These organs make, store, and get rid of urine in the body. An upper UTI affects the ureters and kidneys. A lower UTI affects the bladder and urethra. What are the causes? Most urinary tract infections are caused by bacteria in your genital area around your urethra, where urine leaves your body. These bacteria grow and cause inflammation of your urinary tract. What increases the risk? You are more likely to develop this condition if: You have a urinary catheter that stays in place. You are not able to control when you urinate or have a bowel movement (incontinence). You are female and you: Use a spermicide or diaphragm for birth control. Have low estrogen levels. Are pregnant. You have certain genes that increase your risk. You are sexually active. You take antibiotic medicines. You have a condition that causes your flow of urine to slow down, such as: An enlarged prostate, if you are female. Blockage in your urethra. A kidney stone. A nerve condition that affects your bladder control (neurogenic bladder). Not getting enough to drink, or not urinating often. You have certain medical conditions, such as: Diabetes. A weak disease-fighting system (immunesystem). Sickle cell disease. Gout. Spinal cord injury. What are the signs or symptoms? Symptoms of this condition include: Needing to urinate right away (urgency). Frequent urination. This may include small amounts of urine each time you urinate. Pain or burning with urination. Blood in the urine. Urine that smells bad or unusual. Trouble urinating. Cloudy urine. Vaginal discharge, if you are female. Pain in the abdomen or the lower back. You may also have: Vomiting or a decreased appetite. Confusion. Irritability or tiredness. A fever or  chills. Diarrhea. The first symptom in older adults may be confusion. In some cases, they may not have any symptoms until the infection has worsened. How is this diagnosed? This condition is diagnosed based on your medical history and a physical exam. You may also have other tests, including: Urine tests. Blood tests. Tests for STIs (sexually transmitted infections). If you have had more than one UTI, a cystoscopy or imaging studies may be done to determine the cause of the infections. How is this treated? Treatment for this condition includes: Antibiotic medicine. Over-the-counter medicines to treat discomfort. Drinking enough water to stay hydrated. If you have frequent infections or have other conditions such as a kidney stone, you may need to see a health care provider who specializes in the urinary tract (urologist). In rare cases, urinary tract infections can cause sepsis. Sepsis is a life-threatening condition that occurs when the body responds to an infection. Sepsis is treated in the hospital with IV antibiotics, fluids, and other medicines. Follow these instructions at home:  Medicines Take over-the-counter and prescription medicines only as told by your health care provider. If you were prescribed an antibiotic medicine, take it as told by your health care provider. Do not stop using the antibiotic even if you start to feel better. General instructions Make sure you: Empty your bladder often and completely. Do not hold urine for long periods of time. Empty your bladder after sex. Wipe from front to back after urinating or having a bowel movement if you are female. Use each tissue only one time when you wipe. Drink enough fluid to keep your urine pale yellow. Keep all follow-up visits. This is important. Contact a health   care provider if: Your symptoms do not get better after 1-2 days. Your symptoms go away and then return. Get help right away if: You have severe pain in  your back or your lower abdomen. You have a fever or chills. You have nausea or vomiting. Summary A urinary tract infection (UTI) is an infection of any part of the urinary tract, which includes the kidneys, ureters, bladder, and urethra. Most urinary tract infections are caused by bacteria in your genital area. Treatment for this condition often includes antibiotic medicines. If you were prescribed an antibiotic medicine, take it as told by your health care provider. Do not stop using the antibiotic even if you start to feel better. Keep all follow-up visits. This is important. This information is not intended to replace advice given to you by your health care provider. Make sure you discuss any questions you have with your health care provider. Document Revised: 01/28/2020 Document Reviewed: 01/28/2020 Elsevier Patient Education  2023 Elsevier Inc.  

## 2022-01-29 ENCOUNTER — Encounter (HOSPITAL_BASED_OUTPATIENT_CLINIC_OR_DEPARTMENT_OTHER): Payer: Self-pay

## 2022-01-29 ENCOUNTER — Ambulatory Visit (HOSPITAL_BASED_OUTPATIENT_CLINIC_OR_DEPARTMENT_OTHER)
Admission: RE | Admit: 2022-01-29 | Discharge: 2022-01-29 | Disposition: A | Discharge status: Home / Self Care | Payer: BC Managed Care – PPO | Source: Ambulatory Visit | Attending: Urology | Admitting: Urology

## 2022-01-29 DIAGNOSIS — R3129 Other microscopic hematuria: Secondary | ICD-10-CM | POA: Diagnosis not present

## 2022-01-29 MED ORDER — IOHEXOL 300 MG/ML  SOLN
100.0000 mL | Freq: Once | INTRAMUSCULAR | Status: AC | PRN
  Administered 2022-01-29: 100 mL via INTRAVENOUS

## 2022-02-14 ENCOUNTER — Other Ambulatory Visit: Payer: Self-pay | Admitting: Urology

## 2022-02-14 DIAGNOSIS — N361 Urethral diverticulum: Secondary | ICD-10-CM

## 2022-02-19 ENCOUNTER — Telehealth: Payer: Self-pay

## 2022-02-19 NOTE — Telephone Encounter (Signed)
Spoke to patient about Dr. Dimas Millin response to CT.  I informed her someone from [facility] will be reaching out to her to set up an apt.  She questioned why she was being referred, I informed her this is not something we treat here so Dr. Ronne Binning referred her to [facility] for treatment options.  Patient voiced understanding.

## 2022-02-19 NOTE — Telephone Encounter (Signed)
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 I will be referring her to [facility] ----- Message ----- From: Grier Rocher, CMA Sent: 01/30/2022  11:45 AM EDT To: Malen Gauze, MD  Please review, no f/u scheduled.

## 2022-05-15 ENCOUNTER — Other Ambulatory Visit: Payer: Self-pay | Admitting: Urology

## 2022-05-15 DIAGNOSIS — N361 Urethral diverticulum: Secondary | ICD-10-CM

## 2022-06-15 ENCOUNTER — Other Ambulatory Visit: Payer: BC Managed Care – PPO

## 2022-08-09 ENCOUNTER — Ambulatory Visit: Payer: BC Managed Care – PPO | Admitting: Obstetrics and Gynecology

## 2022-08-19 ENCOUNTER — Encounter: Payer: Self-pay | Admitting: *Deleted

## 2022-09-02 ENCOUNTER — Other Ambulatory Visit: Payer: Self-pay | Admitting: Urology

## 2022-09-02 DIAGNOSIS — N361 Urethral diverticulum: Secondary | ICD-10-CM

## 2022-09-10 ENCOUNTER — Encounter: Payer: Self-pay | Admitting: Urology

## 2022-09-21 ENCOUNTER — Ambulatory Visit
Admission: RE | Admit: 2022-09-21 | Discharge: 2022-09-21 | Disposition: A | Discharge status: Home / Self Care | Payer: BC Managed Care – PPO | Source: Ambulatory Visit | Attending: Urology | Admitting: Urology

## 2022-09-21 DIAGNOSIS — N361 Urethral diverticulum: Secondary | ICD-10-CM

## 2022-09-30 ENCOUNTER — Ambulatory Visit
Admission: RE | Admit: 2022-09-30 | Discharge: 2022-09-30 | Disposition: A | Discharge status: Home / Self Care | Payer: BC Managed Care – PPO | Source: Ambulatory Visit | Attending: Urology | Admitting: Urology

## 2022-09-30 MED ORDER — GADOPICLENOL 0.5 MMOL/ML IV SOLN
9.0000 mL | Freq: Once | INTRAVENOUS | Status: AC | PRN
  Administered 2022-09-30: 9 mL via INTRAVENOUS

## 2022-10-29 ENCOUNTER — Ambulatory Visit: Payer: BC Managed Care – PPO | Admitting: Obstetrics and Gynecology

## 2023-02-10 ENCOUNTER — Encounter: Payer: Self-pay | Admitting: Obstetrics and Gynecology

## 2023-02-10 ENCOUNTER — Ambulatory Visit (INDEPENDENT_AMBULATORY_CARE_PROVIDER_SITE_OTHER): Payer: BC Managed Care – PPO | Admitting: Obstetrics and Gynecology

## 2023-02-10 ENCOUNTER — Other Ambulatory Visit (HOSPITAL_COMMUNITY)
Admission: RE | Admit: 2023-02-10 | Discharge: 2023-02-10 | Disposition: A | Discharge status: Home / Self Care | Payer: BC Managed Care – PPO | Attending: Obstetrics and Gynecology | Admitting: Obstetrics and Gynecology

## 2023-02-10 VITALS — BP 113/73 | HR 76 | Ht 62.99 in | Wt 196.0 lb

## 2023-02-10 DIAGNOSIS — R350 Frequency of micturition: Secondary | ICD-10-CM

## 2023-02-10 DIAGNOSIS — N361 Urethral diverticulum: Secondary | ICD-10-CM

## 2023-02-10 DIAGNOSIS — R319 Hematuria, unspecified: Secondary | ICD-10-CM

## 2023-02-10 LAB — POCT URINALYSIS DIPSTICK
Bilirubin, UA: NEGATIVE
Glucose, UA: NEGATIVE
Ketones, UA: NEGATIVE
Leukocytes, UA: NEGATIVE
Nitrite, UA: NEGATIVE
Protein, UA: NEGATIVE
Spec Grav, UA: 1.02 (ref 1.010–1.025)
Urobilinogen, UA: 0.2 E.U./dL
pH, UA: 6 (ref 5.0–8.0)

## 2023-02-10 LAB — URINALYSIS, ROUTINE W REFLEX MICROSCOPIC
Bacteria, UA: NONE SEEN
Bilirubin Urine: NEGATIVE
Glucose, UA: NEGATIVE mg/dL
Ketones, ur: NEGATIVE mg/dL
Leukocytes,Ua: NEGATIVE
Nitrite: NEGATIVE
Protein, ur: NEGATIVE mg/dL
Specific Gravity, Urine: 1.012 (ref 1.005–1.030)
pH: 6 (ref 5.0–8.0)
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
## 2023-03-10 ENCOUNTER — Encounter: Payer: Self-pay | Admitting: Obstetrics and Gynecology

## 2023-06-11 ENCOUNTER — Encounter: Payer: Self-pay | Admitting: Urology

## 2023-06-12 ENCOUNTER — Encounter: Payer: BC Managed Care – PPO | Admitting: Obstetrics and Gynecology

## 2023-06-16 ENCOUNTER — Ambulatory Visit (INDEPENDENT_AMBULATORY_CARE_PROVIDER_SITE_OTHER): Payer: BC Managed Care – PPO

## 2023-06-16 ENCOUNTER — Other Ambulatory Visit (HOSPITAL_COMMUNITY)
Admission: RE | Admit: 2023-06-16 | Discharge: 2023-06-16 | Disposition: A | Discharge status: Home / Self Care | Payer: BC Managed Care – PPO | Source: Ambulatory Visit | Attending: Obstetrics and Gynecology | Admitting: Obstetrics and Gynecology

## 2023-06-16 VITALS — BP 108/73 | HR 79

## 2023-06-16 DIAGNOSIS — R82998 Other abnormal findings in urine: Secondary | ICD-10-CM

## 2023-06-16 DIAGNOSIS — R350 Frequency of micturition: Secondary | ICD-10-CM | POA: Diagnosis not present

## 2023-06-16 DIAGNOSIS — R319 Hematuria, unspecified: Secondary | ICD-10-CM

## 2023-06-16 LAB — URINALYSIS, ROUTINE W REFLEX MICROSCOPIC
Bilirubin Urine: NEGATIVE
Glucose, UA: NEGATIVE mg/dL
Ketones, ur: NEGATIVE mg/dL
Nitrite: POSITIVE — AB
Protein, ur: NEGATIVE mg/dL
Specific Gravity, Urine: 1.019 (ref 1.005–1.030)
pH: 6 (ref 5.0–8.0)

## 2023-06-16 LAB — POCT URINALYSIS DIPSTICK
Bilirubin, UA: NEGATIVE
Glucose, UA: POSITIVE — AB
Ketones, UA: NEGATIVE
Leukocytes, UA: NEGATIVE
Nitrite, UA: POSITIVE
Protein, UA: NEGATIVE
Spec Grav, UA: 1.02 (ref 1.010–1.025)
Urobilinogen, UA: 1 U/dL
pH, UA: 7 (ref 5.0–8.0)

## 2023-06-16 MED ORDER — NITROFURANTOIN MONOHYD MACRO 100 MG PO CAPS: 100.0000 mg | ORAL_CAPSULE | Freq: Two times a day (BID) | ORAL | 0 refills | Status: DC

## 2023-06-16 MED ORDER — PHENAZOPYRIDINE HCL 200 MG PO TABS: 200.0000 mg | ORAL_TABLET | Freq: Three times a day (TID) | ORAL | 0 refills | Status: AC | PRN

## 2023-06-16 NOTE — Progress Notes (Signed)
Patricia Patel is a 42 y.o. female  arrived today with UTI sx.  Per Dr. Jari Favre protocol: A urine specimen was collected and POCT Urine was done and urine culture sent to the lab. POCT Urine was Positive  Pt was notified and prescription sent to the preferred pharmacy.

## 2023-06-16 NOTE — Patient Instructions (Addendum)
Your Urine dip that was done in office was Positive. I am sending the urine off for culture and you can take AZO over the counter for your discomfort.  We have also ordered Macrobid and Pyridium for you to take while we wait for your culture results, hopefully this gives you some relief. We will contact you when the results are back between 3-5 days. If a different antibiotic is needed we will sent the order to the pharmacy and you will be notified. If you have any questions or concerns please feel free to call us at [phone]

## 2023-06-17 ENCOUNTER — Ambulatory Visit (INDEPENDENT_AMBULATORY_CARE_PROVIDER_SITE_OTHER): Payer: BC Managed Care – PPO | Admitting: Obstetrics and Gynecology

## 2023-06-17 ENCOUNTER — Encounter: Payer: Self-pay | Admitting: Obstetrics and Gynecology

## 2023-06-17 VITALS — BP 117/77 | HR 83 | Wt 191.0 lb

## 2023-06-17 DIAGNOSIS — Z01818 Encounter for other preprocedural examination: Secondary | ICD-10-CM

## 2023-06-17 MED ORDER — OXYCODONE HCL 5 MG PO TABS: 5.0000 mg | ORAL_TABLET | ORAL | 0 refills | Status: DC | PRN

## 2023-06-17 MED ORDER — ACETAMINOPHEN 500 MG PO TABS: 500.0000 mg | ORAL_TABLET | Freq: Four times a day (QID) | ORAL | 0 refills | Status: DC | PRN

## 2023-06-17 MED ORDER — IBUPROFEN 600 MG PO TABS: 600.0000 mg | ORAL_TABLET | Freq: Four times a day (QID) | ORAL | 0 refills | Status: AC | PRN

## 2023-06-17 MED ORDER — POLYETHYLENE GLYCOL 3350 17 GM/SCOOP PO POWD: 17.0000 g | Freq: Every day | ORAL | 0 refills | Status: DC

## 2023-06-17 NOTE — Progress Notes (Signed)
[facility] Pre-Operative Exam  Subjective Chief Complaint: Patricia Patel presents for a preoperative encounter.   History of Present Illness: Patricia Patel is a 42 y.o. female who presents for preoperative visit.  She is scheduled to undergo Exam under anesthesia, urethral diverticulectomy, and cystoscopy on 06/23/23.  Her symptoms include urethral diverticulum.  Urodynamics showed: Deferred  No past medical history on file.   Past Surgical History:  Procedure Laterality Date   APPENDECTOMY     ECTOPIC PREGNANCY SURGERY     reversal of tubal     TUBAL LIGATION     VAGINAL HYSTERECTOMY      has no known allergies.   No family history on file.  Social History   Tobacco Use   Smoking status: Never   Smokeless tobacco: Never  Vaping Use   Vaping status: Never Used  Substance Use Topics   Alcohol use: Not Currently   Drug use: Not Currently     Review of Systems was negative for a full 10 system review except as noted in the History of Present Illness.   Current Outpatient Medications:    ibuprofen (ADVIL) 800 MG tablet, Take 800 mg by mouth every 8 (eight) hours as needed., Disp: , Rfl:    nitrofurantoin, macrocrystal-monohydrate, (MACROBID) 100 MG capsule, Take 1 capsule (100 mg total) by mouth 2 (two) times daily., Disp: 10 capsule, Rfl: 0   phenazopyridine (PYRIDIUM) 200 MG tablet, Take 1 tablet (200 mg total) by mouth 3 (three) times daily as needed for pain., Disp: 10 tablet, Rfl: 0   sertraline (ZOLOFT) 50 MG tablet, TAKE 1 & 1 2 (ONE & ONE HALF) TABLETS BY MOUTH ONCE DAILY, Disp: , Rfl:    valACYclovir (VALTREX) 1000 MG tablet, Take 1,000 mg by mouth 3 (three) times daily., Disp: , Rfl:    Objective Vitals:   06/17/23 1530  BP: 117/77  Pulse: 83    Gen: NAD CV: S1 S2 RRR Lungs: Clear to auscultation bilaterally Abd: soft, nontender   Previous Pelvic Exam showed: Normal external female genitalia; Bartholin's and Skene's glands  normal in appearance; urethral meatus normal in appearance, 3cm well circumscribed mass/ cyst palpated on anterior vaginal wall   CST: negative     s/p hysterectomy: Speculum exam reveals normal vaginal mucosa without  atrophy and normal vaginal cuff.  Adnexa no mass, fullness, tenderness.       Pelvic floor strength II/V   Pelvic floor musculature: Right levator non-tender, Right obturator non-tender, Left levator non-tender, Left obturator non-tender   POP-Q:    POP-Q   -1                                            Aa   -1                                           Ba   -7.5                                              C    4.5  Gh   4.5                                            Pb   9                                            tvl    -2                                            Ap   -2                                            Bp                                                  D          Rectal Exam:  Normal external rectum    Assessment/ Plan  Assessment: The patient is a 42 y.o. year old scheduled to undergo  Exam under anesthesia, urethral diverticulectomy, and cystoscopy. Verbal consent was obtained for these procedures.  Plan: General Surgical Consent: The patient has previously been counseled on alternative treatments, and the decision by the patient and provider was to proceed with the procedure listed above.  For all procedures, there are risks of bleeding, infection, damage to surrounding organs including but not limited to bowel, bladder, blood vessels, ureters and nerves, and need for further surgery if an injury were to occur. These risks are all low with minimally invasive surgery.   There are risks of numbness and weakness at any body site or buttock/rectal pain.  It is possible that baseline pain can be worsened by surgery, either with or without mesh. If surgery is vaginal, there is also a low risk  of possible conversion to laparoscopy or open abdominal incision where indicated. Very rare risks include blood transfusion, blood clot, heart attack, pneumonia, or death.   There is also a risk of short-term postoperative urinary retention with need to use a catheter. About half of patients need to go home from surgery with a catheter, which is then later removed in the office. The risk of long-term need for a catheter is very low. There is also a risk of worsening of overactive bladder.   e have close postoperative follow up to identify any potential complications from mesh.    We discussed consent for blood products. Risks for blood transfusion include allergic reactions, other reactions that can affect different body organs and managed accordingly, transmission of infectious diseases such as HIV or Hepatitis. However, the blood is screened. Patient consents for blood products.  Pre-operative instructions:  She was instructed to not take Aspirin/NSAIDs x 7days prior to surgery. Antibiotic prophylaxis was ordered as indicated.  Catheter use: Patient will go home with foley if needed after post-operative voiding trial.  Post-operative instructions:  She was provided with specific post-operative  instructions, including precautions and signs/symptoms for which we would recommend contacting us, in addition to daytime and after-hours contact phone numbers. This was provided on a handout.   Post-operative medications: Prescriptions for motrin, tylenol, miralax, and oxycodone were sent to her pharmacy. Discussed using ibuprofen and tylenol on a schedule to limit use of narcotics.   Laboratory testing:  We will check labs: As requested by anesthesia.   Preoperative clearance:  She does not require surgical clearance.    Post-operative follow-up:  A post-operative appointment will be made for 6 weeks from the date of surgery. If she needs a post-operative nurse visit for a voiding trial, that will be  set up after she leaves the hospital.    Patient will call the clinic or use MyChart should anything change or any new issues arise.   Selmer Dominion, NP

## 2023-06-17 NOTE — H&P (Signed)
[facility] H&P  Subjective Chief Complaint: Patricia Patel presents for a preoperative encounter.   History of Present Illness: Patricia Patel is a 42 y.o. female who presents for preoperative visit.  She is scheduled to undergo Exam under anesthesia, urethral diverticulectomy, and cystoscopy on 06/23/23.  Her symptoms include urethral diverticulum.  Urodynamics showed: Deferred  No past medical history on file.   Past Surgical History:  Procedure Laterality Date   APPENDECTOMY     ECTOPIC PREGNANCY SURGERY     reversal of tubal     TUBAL LIGATION     VAGINAL HYSTERECTOMY      has no known allergies.   No family history on file.  Social History   Tobacco Use   Smoking status: Never   Smokeless tobacco: Never  Vaping Use   Vaping status: Never Used  Substance Use Topics   Alcohol use: Not Currently   Drug use: Not Currently     Review of Systems was negative for a full 10 system review except as noted in the History of Present Illness.  No current facility-administered medications for this encounter.  Current Outpatient Medications:    acetaminophen (TYLENOL) 500 MG tablet, Take 1 tablet (500 mg total) by mouth every 6 (six) hours as needed (pain)., Disp: 30 tablet, Rfl: 0   ibuprofen (ADVIL) 600 MG tablet, Take 1 tablet (600 mg total) by mouth every 6 (six) hours as needed., Disp: 30 tablet, Rfl: 0   ibuprofen (ADVIL) 800 MG tablet, Take 800 mg by mouth every 8 (eight) hours as needed., Disp: , Rfl:    nitrofurantoin, macrocrystal-monohydrate, (MACROBID) 100 MG capsule, Take 1 capsule (100 mg total) by mouth 2 (two) times daily., Disp: 10 capsule, Rfl: 0   oxyCODONE (OXY IR/ROXICODONE) 5 MG immediate release tablet, Take 1 tablet (5 mg total) by mouth every 4 (four) hours as needed for severe pain (pain score 7-10)., Disp: 15 tablet, Rfl: 0   phenazopyridine (PYRIDIUM) 200 MG tablet, Take 1 tablet (200 mg total) by mouth 3 (three) times daily as  needed for pain., Disp: 10 tablet, Rfl: 0   polyethylene glycol powder (GLYCOLAX/MIRALAX) 17 GM/SCOOP powder, Take 17 g by mouth daily. Drink 17g (1 scoop) dissolved in water per day., Disp: 255 g, Rfl: 0   sertraline (ZOLOFT) 50 MG tablet, TAKE 1 & 1 2 (ONE & ONE HALF) TABLETS BY MOUTH ONCE DAILY, Disp: , Rfl:    valACYclovir (VALTREX) 1000 MG tablet, Take 1,000 mg by mouth 3 (three) times daily., Disp: , Rfl:    Objective There were no vitals filed for this visit.   Gen: NAD CV: S1 S2 RRR Lungs: Clear to auscultation bilaterally Abd: soft, nontender   Previous Pelvic Exam showed: Normal external female genitalia; Bartholin's and Skene's glands normal in appearance; urethral meatus normal in appearance, 3cm well circumscribed mass/ cyst palpated on anterior vaginal wall   CST: negative     s/p hysterectomy: Speculum exam reveals normal vaginal mucosa without  atrophy and normal vaginal cuff.  Adnexa no mass, fullness, tenderness.       Pelvic floor strength II/V   Pelvic floor musculature: Right levator non-tender, Right obturator non-tender, Left levator non-tender, Left obturator non-tender   POP-Q:    POP-Q   -1  Aa   -1                                           Ba   -7.5                                              C    4.5                                            Gh   4.5                                            Pb   9                                            tvl    -2                                            Ap   -2                                            Bp                                                  D          Rectal Exam:  Normal external rectum    Assessment/ Plan  Assessment: The patient is a 42 y.o. year old scheduled to undergo  Exam under anesthesia, urethral diverticulectomy, and cystoscopy. Verbal consent was obtained for these procedures.

## 2023-06-18 ENCOUNTER — Encounter (HOSPITAL_BASED_OUTPATIENT_CLINIC_OR_DEPARTMENT_OTHER): Payer: Self-pay | Admitting: Obstetrics and Gynecology

## 2023-06-18 LAB — URINE CULTURE: Culture: 10000 — AB

## 2023-06-18 NOTE — Progress Notes (Signed)
Spoke w/ via phone for pre-op interview--- [facility] needs dos---- NONE         Lab results------ COVID test -----patient states asymptomatic no test needed Arrive at -------0900 NPO after MN NO Solid Food.  Clear liquids from MN until---0800 Med rec completed Medications to take morning of surgery -----Zoloft and Valtrex PRN Diabetic medication ----- Patient instructed no nail polish to be worn day of surgery Patient instructed to bring photo id and insurance card day of surgery Patient aware to have Driver (ride ) / caregiver    for 24 hours after surgery - Daughter Anthony Sar Patient Special Instructions ----- Pre-Op special Instructions ----- Patient verbalized understanding of instructions that were given at this phone interview. Patient denies chest pain, sob, fever, cough at the interview.

## 2023-06-20 ENCOUNTER — Encounter (HOSPITAL_BASED_OUTPATIENT_CLINIC_OR_DEPARTMENT_OTHER): Payer: Self-pay | Admitting: Obstetrics and Gynecology

## 2023-06-20 NOTE — Progress Notes (Signed)
Talked with patient. Was aware of time change to 0600. Clear liquids until 0500.

## 2023-06-20 NOTE — Anesthesia Preprocedure Evaluation (Signed)
Anesthesia Evaluation  Patient identified by MRN, date of birth, ID band Patient awake    Reviewed: Allergy & Precautions, NPO status , Patient's Chart, lab work & pertinent test results  Airway Mallampati: II  TM Distance: >3 FB Neck ROM: Full    Dental no notable dental hx. (+) Dental Advisory Given   Pulmonary neg pulmonary ROS   Pulmonary exam normal breath sounds clear to auscultation       Cardiovascular negative cardio ROS Normal cardiovascular exam Rhythm:Regular Rate:Normal     Neuro/Psych  PSYCHIATRIC DISORDERS Anxiety Depression    negative neurological ROS     GI/Hepatic negative GI ROS, Neg liver ROS,,,  Endo/Other  Obesity  Renal/GU negative Renal ROS   Urethral diverticulum    Musculoskeletal negative musculoskeletal ROS (+)    Abdominal   Peds  Hematology negative hematology ROS (+)   Anesthesia Other Findings   Reproductive/Obstetrics negative OB ROS                              Anesthesia Physical Anesthesia Plan  ASA: 2  Anesthesia Plan: General   Post-op Pain Management: Tylenol PO (pre-op)*, Precedex and Dilaudid IV   Induction: Intravenous  PONV Risk Score and Plan: 4 or greater and Treatment may vary due to age or medical condition, Midazolam, Ondansetron and Dexamethasone  Airway Management Planned: LMA  Additional Equipment: None  Intra-op Plan:   Post-operative Plan: Extubation in OR  Informed Consent: I have reviewed the patients History and Physical, chart, labs and discussed the procedure including the risks, benefits and alternatives for the proposed anesthesia with the patient or authorized representative who has indicated his/her understanding and acceptance.     Dental advisory given  Plan Discussed with: CRNA and Anesthesiologist  Anesthesia Plan Comments:         Anesthesia Quick Evaluation

## 2023-06-23 ENCOUNTER — Other Ambulatory Visit: Payer: Self-pay

## 2023-06-23 ENCOUNTER — Ambulatory Visit (HOSPITAL_BASED_OUTPATIENT_CLINIC_OR_DEPARTMENT_OTHER): Payer: Self-pay | Admitting: Anesthesiology

## 2023-06-23 ENCOUNTER — Ambulatory Visit (HOSPITAL_BASED_OUTPATIENT_CLINIC_OR_DEPARTMENT_OTHER)
Admission: RE | Admit: 2023-06-23 | Discharge: 2023-06-23 | Disposition: A | Discharge status: Home / Self Care | Payer: BC Managed Care – PPO | Attending: Obstetrics and Gynecology | Admitting: Obstetrics and Gynecology

## 2023-06-23 ENCOUNTER — Encounter (HOSPITAL_BASED_OUTPATIENT_CLINIC_OR_DEPARTMENT_OTHER): Payer: Self-pay | Admitting: Obstetrics and Gynecology

## 2023-06-23 ENCOUNTER — Encounter (HOSPITAL_BASED_OUTPATIENT_CLINIC_OR_DEPARTMENT_OTHER)
Admission: RE | Disposition: A | Discharge status: Home / Self Care | Payer: Self-pay | Source: Home / Self Care | Attending: Obstetrics and Gynecology

## 2023-06-23 DIAGNOSIS — N361 Urethral diverticulum: Secondary | ICD-10-CM

## 2023-06-23 DIAGNOSIS — Z6826 Body mass index (BMI) 26.0-26.9, adult: Secondary | ICD-10-CM | POA: Insufficient documentation

## 2023-06-23 DIAGNOSIS — F32A Depression, unspecified: Secondary | ICD-10-CM | POA: Insufficient documentation

## 2023-06-23 DIAGNOSIS — E669 Obesity, unspecified: Secondary | ICD-10-CM | POA: Insufficient documentation

## 2023-06-23 DIAGNOSIS — F419 Anxiety disorder, unspecified: Secondary | ICD-10-CM | POA: Diagnosis not present

## 2023-06-23 HISTORY — DX: Anxiety disorder, unspecified: F41.9

## 2023-06-23 HISTORY — DX: Depression, unspecified: F32.A

## 2023-06-23 HISTORY — PX: CYSTOSCOPY: SHX5120

## 2023-06-23 HISTORY — PX: URETHRAL CYST REMOVAL: SHX5128

## 2023-06-23 SURGERY — EXCISION, CYST, PERIURETHRAL
Anesthesia: General

## 2023-06-23 MED ORDER — KETOROLAC TROMETHAMINE 30 MG/ML IJ SOLN
INTRAMUSCULAR | Status: DC | PRN
  Administered 2023-06-23: 30 mg via INTRAVENOUS

## 2023-06-23 MED ORDER — OXYCODONE HCL 5 MG PO TABS
5.0000 mg | ORAL_TABLET | Freq: Once | ORAL | Status: AC | PRN
  Administered 2023-06-23: 5 mg via ORAL

## 2023-06-23 MED ORDER — ONDANSETRON HCL 4 MG/2ML IJ SOLN
INTRAMUSCULAR | Status: DC | PRN
  Administered 2023-06-23: 4 mg via INTRAVENOUS

## 2023-06-23 MED ORDER — EPHEDRINE 5 MG/ML INJ
INTRAVENOUS | Status: AC
  Filled 2023-06-23: qty 5

## 2023-06-23 MED ORDER — FENTANYL CITRATE (PF) 100 MCG/2ML IJ SOLN
INTRAMUSCULAR | Status: AC
  Filled 2023-06-23: qty 2

## 2023-06-23 MED ORDER — OXYCODONE HCL 5 MG/5ML PO SOLN: 5.0000 mg | Freq: Once | ORAL | Status: AC | PRN

## 2023-06-23 MED ORDER — MIDAZOLAM HCL 2 MG/2ML IJ SOLN
INTRAMUSCULAR | Status: AC
  Filled 2023-06-23: qty 2

## 2023-06-23 MED ORDER — FENTANYL CITRATE (PF) 100 MCG/2ML IJ SOLN
25.0000 ug | INTRAMUSCULAR | Status: DC | PRN
  Administered 2023-06-23 (×2): 50 ug via INTRAVENOUS

## 2023-06-23 MED ORDER — ONDANSETRON HCL 4 MG/2ML IJ SOLN: 4.0000 mg | Freq: Once | INTRAMUSCULAR | Status: DC | PRN

## 2023-06-23 MED ORDER — MIDAZOLAM HCL 5 MG/5ML IJ SOLN
INTRAMUSCULAR | Status: DC | PRN
  Administered 2023-06-23: 2 mg via INTRAVENOUS

## 2023-06-23 MED ORDER — PHENYLEPHRINE HCL (PRESSORS) 10 MG/ML IV SOLN
INTRAVENOUS | Status: AC
  Filled 2023-06-23: qty 1

## 2023-06-23 MED ORDER — OXYCODONE HCL 5 MG PO TABS
ORAL_TABLET | ORAL | Status: AC
  Filled 2023-06-23: qty 1

## 2023-06-23 MED ORDER — STERILE WATER FOR IRRIGATION IR SOLN
Status: DC | PRN
  Administered 2023-06-23: 1000 mL

## 2023-06-23 MED ORDER — ARTIFICIAL TEARS OPHTHALMIC OINT
TOPICAL_OINTMENT | OPHTHALMIC | Status: AC
  Filled 2023-06-23: qty 3.5

## 2023-06-23 MED ORDER — PHENAZOPYRIDINE HCL 100 MG PO TABS
200.0000 mg | ORAL_TABLET | ORAL | Status: AC
  Administered 2023-06-23: 200 mg via ORAL

## 2023-06-23 MED ORDER — ONDANSETRON HCL 4 MG/2ML IJ SOLN
INTRAMUSCULAR | Status: AC
  Filled 2023-06-23: qty 2

## 2023-06-23 MED ORDER — PHENAZOPYRIDINE HCL 100 MG PO TABS
ORAL_TABLET | ORAL | Status: AC
  Filled 2023-06-23: qty 2

## 2023-06-23 MED ORDER — KETOROLAC TROMETHAMINE 30 MG/ML IJ SOLN
INTRAMUSCULAR | Status: AC
  Filled 2023-06-23: qty 1

## 2023-06-23 MED ORDER — PHENYLEPHRINE 80 MCG/ML (10ML) SYRINGE FOR IV PUSH (FOR BLOOD PRESSURE SUPPORT)
PREFILLED_SYRINGE | INTRAVENOUS | Status: AC
  Filled 2023-06-23: qty 10

## 2023-06-23 MED ORDER — DEXAMETHASONE SODIUM PHOSPHATE 10 MG/ML IJ SOLN
INTRAMUSCULAR | Status: AC
  Filled 2023-06-23: qty 1

## 2023-06-23 MED ORDER — GABAPENTIN 300 MG PO CAPS
300.0000 mg | ORAL_CAPSULE | ORAL | Status: AC
  Administered 2023-06-23: 300 mg via ORAL

## 2023-06-23 MED ORDER — GABAPENTIN 300 MG PO CAPS
ORAL_CAPSULE | ORAL | Status: AC
  Filled 2023-06-23: qty 1

## 2023-06-23 MED ORDER — PHENYLEPHRINE HCL-NACL 20-0.9 MG/250ML-% IV SOLN
INTRAVENOUS | Status: DC | PRN
  Administered 2023-06-23: 40 ug/min via INTRAVENOUS

## 2023-06-23 MED ORDER — SULFAMETHOXAZOLE-TRIMETHOPRIM 800-160 MG PO TABS: 1.0000 | ORAL_TABLET | Freq: Every day | ORAL | 0 refills | Status: AC

## 2023-06-23 MED ORDER — EPHEDRINE SULFATE-NACL 50-0.9 MG/10ML-% IV SOSY
PREFILLED_SYRINGE | INTRAVENOUS | Status: DC | PRN
  Administered 2023-06-23: 2.5 mg via INTRAVENOUS
  Administered 2023-06-23: 5 mg via INTRAVENOUS

## 2023-06-23 MED ORDER — ACETAMINOPHEN 500 MG PO TABS
ORAL_TABLET | ORAL | Status: AC
  Filled 2023-06-23: qty 2

## 2023-06-23 MED ORDER — ACETAMINOPHEN 500 MG PO TABS
1000.0000 mg | ORAL_TABLET | ORAL | Status: AC
  Administered 2023-06-23: 1000 mg via ORAL

## 2023-06-23 MED ORDER — PROPOFOL 10 MG/ML IV BOLUS
INTRAVENOUS | Status: AC
  Filled 2023-06-23: qty 20

## 2023-06-23 MED ORDER — SODIUM CHLORIDE 0.9 % IR SOLN
Status: DC | PRN
  Administered 2023-06-23: 1000 mL

## 2023-06-23 MED ORDER — DEXMEDETOMIDINE HCL IN NACL 80 MCG/20ML IV SOLN
INTRAVENOUS | Status: AC
  Filled 2023-06-23: qty 20

## 2023-06-23 MED ORDER — DEXAMETHASONE SODIUM PHOSPHATE 10 MG/ML IJ SOLN
INTRAMUSCULAR | Status: DC | PRN
  Administered 2023-06-23: 10 mg via INTRAVENOUS

## 2023-06-23 MED ORDER — SODIUM CHLORIDE 0.9 % IV SOLN: INTRAVENOUS | Status: DC

## 2023-06-23 MED ORDER — CEFAZOLIN SODIUM-DEXTROSE 2-4 GM/100ML-% IV SOLN
2.0000 g | INTRAVENOUS | Status: AC
  Administered 2023-06-23: 2 g via INTRAVENOUS

## 2023-06-23 MED ORDER — LIDOCAINE 2% (20 MG/ML) 5 ML SYRINGE
INTRAMUSCULAR | Status: DC | PRN
  Administered 2023-06-23: 100 mg via INTRAVENOUS

## 2023-06-23 MED ORDER — DROPERIDOL 2.5 MG/ML IJ SOLN: 0.6250 mg | Freq: Once | INTRAMUSCULAR | Status: DC | PRN

## 2023-06-23 MED ORDER — PROPOFOL 10 MG/ML IV BOLUS
INTRAVENOUS | Status: DC | PRN
  Administered 2023-06-23: 170 mg via INTRAVENOUS

## 2023-06-23 MED ORDER — CEFAZOLIN SODIUM-DEXTROSE 2-4 GM/100ML-% IV SOLN
INTRAVENOUS | Status: AC
  Filled 2023-06-23: qty 100

## 2023-06-23 MED ORDER — LACTATED RINGERS IV SOLN: INTRAVENOUS | Status: DC

## 2023-06-23 MED ORDER — DEXMEDETOMIDINE HCL IN NACL 80 MCG/20ML IV SOLN
INTRAVENOUS | Status: DC | PRN
  Administered 2023-06-23: 12 ug via INTRAVENOUS

## 2023-06-23 MED ORDER — PHENYLEPHRINE 80 MCG/ML (10ML) SYRINGE FOR IV PUSH (FOR BLOOD PRESSURE SUPPORT)
PREFILLED_SYRINGE | INTRAVENOUS | Status: DC | PRN
  Administered 2023-06-23 (×4): 160 ug via INTRAVENOUS
  Administered 2023-06-23: 80 ug via INTRAVENOUS
  Administered 2023-06-23 (×2): 160 ug via INTRAVENOUS

## 2023-06-23 MED ORDER — FENTANYL CITRATE (PF) 100 MCG/2ML IJ SOLN
INTRAMUSCULAR | Status: DC | PRN
  Administered 2023-06-23: 100 ug via INTRAVENOUS

## 2023-06-23 MED ORDER — SODIUM CHLORIDE 0.9 % IR SOLN
Status: DC | PRN
  Administered 2023-06-23: 2000 mL via INTRAVESICAL

## 2023-06-23 MED ORDER — LIDOCAINE-EPINEPHRINE 1 %-1:100000 IJ SOLN
INTRAMUSCULAR | Status: DC | PRN
  Administered 2023-06-23: 20 mL

## 2023-06-23 SURGICAL SUPPLY — 29 items
CATH FOLEY 2WAY SLVR 5CC 12FR (CATHETERS) IMPLANT
ELECT REM PT RETURN 9FT ADLT (ELECTROSURGICAL) IMPLANT
ELECTRODE REM PT RTRN 9FT ADLT (ELECTROSURGICAL) IMPLANT
GAUZE 4X4 16PLY ~~LOC~~+RFID DBL (SPONGE) IMPLANT
GLOVE BIOGEL PI IND STRL 6.5 (GLOVE) ×1 IMPLANT
GLOVE ECLIPSE 6.0 STRL STRAW (GLOVE) ×1 IMPLANT
GOWN STRL REUS W/TWL LRG LVL3 (GOWN DISPOSABLE) ×1 IMPLANT
HOLDER FOLEY CATH W/STRAP (MISCELLANEOUS) IMPLANT
HYDROGEN PEROXIDE 16OZ (MISCELLANEOUS) IMPLANT
KIT TURNOVER CYSTO (KITS) ×1 IMPLANT
MANIFOLD NEPTUNE II (INSTRUMENTS) ×1 IMPLANT
NS IRRIG 1000ML POUR BTL (IV SOLUTION) ×1 IMPLANT
PACK CYSTO (CUSTOM PROCEDURE TRAY) ×1 IMPLANT
PACK VAGINAL WOMENS (CUSTOM PROCEDURE TRAY) ×1 IMPLANT
PAD OB MATERNITY 4.3X12.25 (PERSONAL CARE ITEMS) ×1 IMPLANT
RETRACTOR LONE STAR DISPOSABLE (INSTRUMENTS) ×1 IMPLANT
RETRACTOR STAY HOOK 5MM (MISCELLANEOUS) ×1 IMPLANT
SET IRRIG Y TYPE TUR BLADDER L (SET/KITS/TRAYS/PACK) IMPLANT
SLEEVE SCD COMPRESS KNEE MED (STOCKING) ×1 IMPLANT
SPIKE FLUID TRANSFER (MISCELLANEOUS) IMPLANT
SUCTION TUBE FRAZIER 10FR DISP (SUCTIONS) IMPLANT
SUT PDS AB 0 CT1 27 (SUTURE) IMPLANT
SUT VIC AB 0 CT1 27XBRD ANBCTR (SUTURE) ×2 IMPLANT
SUT VIC AB 3-0 SH 27X BRD (SUTURE) ×1 IMPLANT
SUT VICRYL 2-0 SH 8X27 (SUTURE) ×1 IMPLANT
SYR BULB EAR ULCER 3OZ GRN STR (SYRINGE) ×1 IMPLANT
TOWEL OR 17X24 6PK STRL BLUE (TOWEL DISPOSABLE) ×1 IMPLANT
TRAY FOLEY W/BAG SLVR 14FR LF (SET/KITS/TRAYS/PACK) ×1 IMPLANT
UNDERPAD 30X36 HEAVY ABSORB (UNDERPADS AND DIAPERS) ×1 IMPLANT

## 2023-06-23 NOTE — Discharge Instructions (Addendum)
POST OPERATIVE INSTRUCTIONS  General Instructions Recovery (not bed rest) will last approximately 6 weeks Walking is encouraged, but refrain from strenuous exercise/ housework/ heavy lifting. No lifting >10lbs  Nothing in the vagina- NO intercourse, tampons or douching Bathing:  Do not submerge in water (NO swimming, bath, hot tub, etc) until after your postop visit. You can shower starting the day after surgery.  No driving until you are not taking narcotic pain medicine and until your pain is well enough controlled that you can slam on the breaks or make sudden movements if needed.   Taking your medications Please take your acetaminophen and ibuprofen on a schedule for the first 48 hours. Take 600mg  ibuprofen, then take 500mg  acetaminophen 3 hours later, then continue to alternate ibuprofen and acetaminophen. That way you are taking each type of medication every 6 hours. Take the prescribed narcotic (oxycodone, tramadol, etc) as needed, with a maximum being every 4 hours.  Take a stool softener daily to keep your stools soft and preventing you from straining. If you have diarrhea, you decrease your stool softener. This is explained more below. We have prescribed you Miralax.  Reasons to Call the Nurse (see last page for phone numbers) Heavy Bleeding (changing your pad every 1-2 hours) Persistent nausea/vomiting Fever (100.4 degrees or more) Incision problems (pus or other fluid coming out, redness, warmth, increased pain)  Things to Expect After Surgery Mild to Moderate pain is normal during the first day or two after surgery. If prescribed, take Ibuprofen or Tylenol first and use the stronger medicine for "break-through" pain. You can overlap these medicines because they work differently.   Constipation   To Prevent Constipation:  Eat a well-balanced diet including protein, grains, fresh fruit and vegetables.  Drink plenty of fluids. Walk regularly.  Depending on specific instructions  from your physician: take Miralax daily and additionally you can add a stool softener (colace/ docusate) and fiber supplement. Continue as long as you're on pain medications.   To Treat Constipation:  If you do not have a bowel movement in 2 days after surgery, you can take 2 Tbs of Milk of Magnesia 1-2 times a day until you have a bowel movement. If diarrhea occurs, decrease the amount or stop the laxative. If no results with Milk of Magnesia, you can drink a bottle of magnesium citrate which you can purchase over the counter.  Fatigue:  This is a normal response to surgery and will improve with time.  Plan frequent rest periods throughout the day.  Gas Pain:  This is very common but can also be very painful! Drink warm liquids such as herbal teas, bouillon or soup. Walking will help you pass more gas.  Mylicon or Gas-X can be taken over the counter.  Leaking Urine:  Varying amounts of leakage may occur after surgery.  This should improve with time. Your bladder needs at least 3 months to recover from surgery. If you leak after surgery, be sure to mention this to your doctor at your post-op visit. If you were taking medications for overactive bladder prior to surgery, be sure to restart the medications immediately after surgery.  Incisions: If you have incisions on your abdomen, the skin glue will dissolve on its own over time. It is ok to gently rinse with soap and water over these incisions but do not scrub.  Catheter You will be discharged home with a catheter for 2 weeks. The office will call you to schedule a voiding trial.  Return to Work  As work demands and recovery times vary widely, it is hard to predict when you will want to return to work. If you have a desk job with no strenuous physical activity, and if you would like to return sooner than generally recommended, discuss this with your provider or call our office.   Post op concerns  For non-emergent issues, please call the  Urogynecology Nurse. Please leave a message and someone will contact you within one business day.  You can also send a message through MyChart.   AFTER HOURS (After 5:00 PM and on weekends):  For urgent matters that cannot wait until the next business day. Call our office [phone] and connect to the doctor on call.  Please reserve this for important issues.   **FOR ANY TRUE EMERGENCY ISSUES CALL 911 OR GO TO THE NEAREST EMERGENCY ROOM.** Please inform our office or the doctor on call of any emergency.     APPOINTMENTS: Call [phone]    Post Anesthesia Home Care Instructions  Activity: Get plenty of rest for the remainder of the day. A responsible individual must stay with you for 24 hours following the procedure.  For the next 24 hours, DO NOT: -Drive a car -Advertising copywriter -Drink alcoholic beverages -Take any medication unless instructed by your physician -Make any legal decisions or sign important papers.  Meals: Start with liquid foods such as gelatin or soup. Progress to regular foods as tolerated. Avoid greasy, spicy, heavy foods. If nausea and/or vomiting occur, drink only clear liquids until the nausea and/or vomiting subsides. Call your physician if vomiting continues.  Special Instructions/Symptoms: Your throat may feel dry or sore from the anesthesia or the breathing tube placed in your throat during surgery. If this causes discomfort, gargle with warm salt water. The discomfort should disappear within 24 hours.

## 2023-06-23 NOTE — Transfer of Care (Signed)
Immediate Anesthesia Transfer of Care Note  Patient: Patricia Patel  Procedure(s) Performed: Urethral diverticulectomy CYSTOSCOPY  Patient Location: PACU  Anesthesia Type:General  Level of Consciousness: awake, alert , oriented, and patient cooperative  Airway & Oxygen Therapy: Patient Spontanous Breathing  Post-op Assessment: Report given to RN and Post -op Vital signs reviewed and stable  Post vital signs: Reviewed and stable  Last Vitals:  Vitals Value Taken Time  BP 112/58 06/23/23 1048  Temp 36.3 C 06/23/23 1048  Pulse 80 06/23/23 1049  Resp 13 06/23/23 1049  SpO2 91 % 06/23/23 1049  Vitals shown include unfiled device data.  Last Pain:  Vitals:   06/23/23 0645  TempSrc: Oral         Complications: No notable events documented.

## 2023-06-23 NOTE — Interval H&P Note (Signed)
History and Physical Interval Note:  06/23/2023 7:48 AM  Patricia Patel  has presented today for surgery, with the diagnosis of urethral diverticulum.  The various methods of treatment have been discussed with the patient and family. After consideration of risks, benefits and other options for treatment, the patient has consented to  Procedure(s): Urethral diverticulectomy (N/A) CYSTOSCOPY (N/A) as a surgical intervention.  The patient's history has been reviewed, patient examined, no change in status, stable for surgery.  I have reviewed the patient's chart and labs.  Questions were answered to the patient's satisfaction.     Marguerita Beards

## 2023-06-23 NOTE — Op Note (Signed)
Operative Note  Preoperative Diagnosis:  urethral diverticulum  Postoperative Diagnosis: same  Procedures performed:  Urethral diverticulectomy, cystoscopy  Implants: none  Attending Surgeon: Lanetta Inch, MD  Anesthesia: General endotracheal  Findings: Large diverticular sac, approximately 5cm, filled with pus. Diverticular ostia present on the mid posterior urethra. On cystoscopy, normal appearing bladder mucosa without injury or lesion. Brisk bilateral ureteral efflux noted.     Specimens:  ID Type Source Tests Collected by Time Destination  1 : Urethral dverticulim Tissue PATH Soft tissue SURGICAL PATHOLOGY Marguerita Beards, MD 06/23/2023 1026   A : Urethral diverticulum Removal GYN Cyst AEROBIC/ANAEROBIC CULTURE W GRAM STAIN (SURGICAL/DEEP WOUND) Marguerita Beards, MD 06/23/2023 [id number]     Estimated blood loss: 150 mL  IV fluids: 700 mL  Urine output: 100 mL  Complications: none  Procedure in Detail:  After informed consent was obtained the patient was taken to the operating room where general anesthesia was induced and found to be adequate.  She was placed in dorsolithotomy position taking care to avoid any nerve injury and prepped and draped in the usual sterile fashion.  Foley catheter was placed and a [place] retractor was placed.  A large suburethral cyst was noted extending up the left side of the urethra, approximately 5cm.   An inverted U-shaped incision was marked with a marking pen in the suburethral space and the vaginal wall was injected with 1% lidocaine with epinephrine.  Cystourethroscopy was performed. An ostia was not identified int eh urethra. The bladder mucosa appeared normal and brisk bilateral ureteral efflux was noted.  The foley was replaced. Incision was made with a 15 blade scalpel. Dissection was performed with Metzenbaum scissors to gently dissect the epithelium off of the vesicovaginal fascia.  The vesicovaginal fascia was then  incised vertically with a scalpel.  Dissection was then performed underneath the fascia to free the cyst wall.  Allis clamps were used to place tension on the cyst and it was dissected carefully along the left side of the urethra. The diverticulum was entered and a large amount of pus was expelled. A culture was obtained. The incision in the diverticulum was extended. The ostia was identified and was probed to confirm connection with the urethra.  With a finger in the diverticular sac, the cyst wall was dissected from the periurethral tissue.  The entire sac was removed and sent to pathology. A 0.64mm opening was noted in the mid posterior urethra.  Repair of the urethra was performed with 3-0 Vicryl suture in an running fashion.  Cystourethroscopy  was was then performed with 70 degree cystoscope and a 360 degree view of the bladder was obtained and there was no injury noted in the bladder. Brisk bilateral urethral efflux was present.  The cystoscope was slowly removed and the urethra was noted to be watertight. The foley catheter was replaced. Hemostasis was achieved with a figure of eight 2-0 vicryl suture on the left periurethral tissue. Copious irrigation was performed.  The dead space on the left was closed with two pursestring 2-0 vicryl sutures. The vesicovaginal fascia was closed in a vest over pants fashion with the left flap under the right with interrupted 2-0 vicryl sutures.  The vaginal epithelium was trimmed then dead space with closed with 2-0 vicryl interrupted sutures. The incision was then closed with a 2-0 Vicryl suture in a running fashion.    Irrigation was performed and good hemostasis was again noted.  The patient tolerated the procedure well and she  was awoken and taken to the recovery room in stable condition.  Needle and sponge count was correct x 2.   Marguerita Beards, MD

## 2023-06-23 NOTE — Anesthesia Procedure Notes (Signed)
Procedure Name: LMA Insertion Date/Time: 06/23/2023 8:07 AM  Performed by: Bishop Limbo, CRNAPre-anesthesia Checklist: Patient identified, Emergency Drugs available, Suction available and Patient being monitored Patient Re-evaluated:Patient Re-evaluated prior to induction Oxygen Delivery Method: Circle System Utilized Preoxygenation: Pre-oxygenation with 100% oxygen Induction Type: IV induction Ventilation: Mask ventilation without difficulty LMA: LMA inserted LMA Size: 4.0 Number of attempts: 1 Placement Confirmation: positive ETCO2 Tube secured with: Tape Dental Injury: Teeth and Oropharynx as per pre-operative assessment

## 2023-06-23 NOTE — Anesthesia Postprocedure Evaluation (Signed)
Anesthesia Post Note  Patient: Patricia Patel  Procedure(s) Performed: Urethral diverticulectomy CYSTOSCOPY     Patient location during evaluation: PACU Anesthesia Type: General Level of consciousness: awake and alert and oriented Pain management: pain level controlled Vital Signs Assessment: post-procedure vital signs reviewed and stable Respiratory status: spontaneous breathing, nonlabored ventilation and respiratory function stable Cardiovascular status: blood pressure returned to baseline and stable Postop Assessment: no apparent nausea or vomiting Anesthetic complications: no   No notable events documented.  Last Vitals:  Vitals:   06/23/23 1130 06/23/23 1145  BP: (!) 99/56 105/62  Pulse: 82 80  Resp: 11 13  Temp:  37 C  SpO2: 92% 93%    Last Pain:  Vitals:   06/23/23 1130  TempSrc:   PainSc: Asleep                 Senora Lacson A.

## 2023-06-24 ENCOUNTER — Other Ambulatory Visit: Payer: Self-pay | Admitting: Obstetrics and Gynecology

## 2023-06-24 ENCOUNTER — Encounter (HOSPITAL_BASED_OUTPATIENT_CLINIC_OR_DEPARTMENT_OTHER): Payer: Self-pay | Admitting: Obstetrics and Gynecology

## 2023-06-24 LAB — SURGICAL PATHOLOGY

## 2023-06-26 ENCOUNTER — Other Ambulatory Visit (HOSPITAL_BASED_OUTPATIENT_CLINIC_OR_DEPARTMENT_OTHER): Payer: Self-pay | Admitting: Obstetrics and Gynecology

## 2023-06-26 DIAGNOSIS — N361 Urethral diverticulum: Secondary | ICD-10-CM

## 2023-06-26 MED ORDER — CIPROFLOXACIN HCL 500 MG PO TABS: 500.0000 mg | ORAL_TABLET | Freq: Two times a day (BID) | ORAL | 0 refills | Status: AC

## 2023-06-28 LAB — AEROBIC/ANAEROBIC CULTURE W GRAM STAIN (SURGICAL/DEEP WOUND)

## 2023-07-03 ENCOUNTER — Other Ambulatory Visit (HOSPITAL_COMMUNITY)
Admission: RE | Admit: 2023-07-03 | Discharge: 2023-07-03 | Disposition: A | Discharge status: Home / Self Care | Payer: 59 | Source: Other Acute Inpatient Hospital | Attending: Obstetrics and Gynecology | Admitting: Obstetrics and Gynecology

## 2023-07-03 ENCOUNTER — Ambulatory Visit (INDEPENDENT_AMBULATORY_CARE_PROVIDER_SITE_OTHER): Payer: 59

## 2023-07-03 DIAGNOSIS — R350 Frequency of micturition: Secondary | ICD-10-CM

## 2023-07-03 LAB — POCT URINALYSIS DIPSTICK
Bilirubin, UA: NEGATIVE
Blood, UA: NEGATIVE
Glucose, UA: NEGATIVE
Ketones, UA: NEGATIVE
Leukocytes, UA: NEGATIVE
Nitrite, UA: NEGATIVE
Protein, UA: NEGATIVE
Spec Grav, UA: 1.01 (ref 1.010–1.025)
Urobilinogen, UA: 0.2 U/dL
pH, UA: 7 (ref 5.0–8.0)

## 2023-07-03 NOTE — Progress Notes (Signed)
 Patricia Patel is a 43 y.o. female arrived today with UTI sx.  A urine specimen was collected and POCT Urine was done. Urine was sent for culture POCT Urine was Negative

## 2023-07-03 NOTE — Patient Instructions (Addendum)
Your Urine dip that was done in office was NEGATIVE. I am sending the urine off for culture and you can take AZO over the counter for your discomfort. We will contact you when the results are back between 3-5 days. If you have any questions or concerns please feel free to call us at [phone]  

## 2023-07-04 LAB — URINE CULTURE: Culture: NO GROWTH

## 2023-07-08 ENCOUNTER — Ambulatory Visit (INDEPENDENT_AMBULATORY_CARE_PROVIDER_SITE_OTHER): Payer: 59 | Admitting: Obstetrics and Gynecology

## 2023-07-08 ENCOUNTER — Encounter: Payer: Self-pay | Admitting: Obstetrics and Gynecology

## 2023-07-08 VITALS — BP 108/74 | HR 76

## 2023-07-08 DIAGNOSIS — Z48816 Encounter for surgical aftercare following surgery on the genitourinary system: Secondary | ICD-10-CM

## 2023-07-08 DIAGNOSIS — Z9889 Other specified postprocedural states: Secondary | ICD-10-CM

## 2023-07-08 NOTE — Progress Notes (Signed)
 Urogyn Nurse Voiding Trial Note  Patricia Patel underwent Urethral diverticulectomy  CYSTOSCOPY on 06/22/24.  She presents today for a voiding trial .   Patient was identified with 2 indicators. 230ml of NS was instilled into the bladder via the catheter. The catheter was removed and patient was instructed to void into the urinary hat. She voided . The post void residual measured by bladder scan was 0ml. She passed the voiding trial and a catheter was not replaced.   The patient received aftercare instructions and will follow up as scheduled.

## 2023-07-08 NOTE — Progress Notes (Signed)
 [facility]  Date of Visit: 07/08/2023  History of Present Illness: Ms. Patricia Patel is a 43 y.o. female scheduled today for a post-operative visit.   Surgery: s/p urethral diverticulectomy on 06/23/23  Voiding trial performed today and passed. Has completed antibiotic course- intra-op culture positive for E.Coli and she was prescribed ciprofloxacin . Has seen a little blood on her pad.   Pathology: URETHRAL DIVERTICULUM, RESECTION:  Urothelial lined diverticular sac showing mucosal erosion and marked  acute and chronic inflammation consistent with a urothelial diverticulum  Negative for atypia   Medications: She has a current medication list which includes the following prescription(s): acetaminophen , ibuprofen , ibuprofen , oxycodone , phenazopyridine , polyethylene glycol powder, sertraline, and valacyclovir.   Allergies: Patient has no known allergies.   Physical Exam: BP 108/74   Pulse 76   LMP 01/31/2020    Pelvic Examination: Vagina: suburethral incision healing well. Sutures present. No erythema or discharge.   ---------------------------------------------------------  Assessment and Plan:  1. Post-operative state     - Healing well.  - Can do light exercise/ activity. Avoid strenuous cardio or weightlifting.  - OK for tub baths and swimming but continue pelvic rest otherwise.   Return 4 weeks for post op check

## 2023-07-10 ENCOUNTER — Telehealth: Payer: Self-pay

## 2023-07-10 NOTE — Telephone Encounter (Signed)
 Patricia Patel is a 43 y.o. female called in requesting to return to work. 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 Pt was notified I would send the message to Dr. Florian Buff

## 2023-07-11 NOTE — Telephone Encounter (Signed)
Letter sent to patient via Mychart

## 2023-07-25 ENCOUNTER — Ambulatory Visit: Payer: 59 | Admitting: Obstetrics and Gynecology

## 2023-07-29 ENCOUNTER — Encounter: Payer: Self-pay | Admitting: Obstetrics and Gynecology

## 2023-07-29 ENCOUNTER — Other Ambulatory Visit (HOSPITAL_COMMUNITY)
Admission: RE | Admit: 2023-07-29 | Discharge: 2023-07-29 | Disposition: A | Discharge status: Home / Self Care | Payer: 59 | Source: Ambulatory Visit | Attending: Obstetrics and Gynecology | Admitting: Obstetrics and Gynecology

## 2023-07-29 ENCOUNTER — Ambulatory Visit (INDEPENDENT_AMBULATORY_CARE_PROVIDER_SITE_OTHER): Payer: 59 | Admitting: Obstetrics and Gynecology

## 2023-07-29 VITALS — BP 121/84 | HR 75

## 2023-07-29 DIAGNOSIS — N393 Stress incontinence (female) (male): Secondary | ICD-10-CM | POA: Insufficient documentation

## 2023-07-29 DIAGNOSIS — R350 Frequency of micturition: Secondary | ICD-10-CM

## 2023-07-29 DIAGNOSIS — B9689 Other specified bacterial agents as the cause of diseases classified elsewhere: Secondary | ICD-10-CM

## 2023-07-29 LAB — POCT URINALYSIS DIPSTICK
Bilirubin, UA: NEGATIVE
Glucose, UA: NEGATIVE
Ketones, UA: NEGATIVE
Leukocytes, UA: NEGATIVE
Nitrite, UA: NEGATIVE
Protein, UA: NEGATIVE
Spec Grav, UA: 1.01 (ref 1.010–1.025)
Urobilinogen, UA: 0.2 U/dL
pH, UA: 6.5 (ref 5.0–8.0)

## 2023-07-29 NOTE — Progress Notes (Addendum)
Patricia Patel  Date of Visit: 07/29/2023  History of Present Illness: Patricia Patel is a 43 y.o. female scheduled today for a post-operative visit.   Surgery: s/p  s/p urethral diverticulectomy on 06/23/23   She passed her postoperative void trial on 07/07/22  Postoperative course has been uncomplicated.   Today she reports she has noticed some vaginal irritation and white discharge. Was given a diflucan by her PCP but still has slight discharge present. Has noticed some leakage with cough, but not a lot.   Pathology results: URETHRAL DIVERTICULUM, RESECTION:  Urothelial lined diverticular sac showing mucosal erosion and marked  acute and chronic inflammation consistent with a urothelial diverticulum  Negative for atypia   Medications: She has a current medication list which includes the following prescription(s): ibuprofen, ibuprofen, phenazopyridine, sertraline, and valacyclovir.   Allergies: Patient has no known allergies.   Physical Exam: BP 121/84   Pulse 75   LMP 01/31/2020    Pelvic Examination: Incisions healing well, sutures present, no drainage or erythema. Speculum shows small amount of white discharge in vagina, aptima obtained.     POC urine negative ---------------------------------------------------------  Assessment and Plan:  1. SUI (stress urinary incontinence, female)   2. Urinary frequency   3. Bacterial vaginosis     - Well healed.  - Can resume activity and intercourse if desired.  - Aptima swab obtained today.  - For SUI treatment, will start with pelvic PT and reevaluate in 3 months. She may need further treatment with sling or urethral bulking.   All questions answered.   Return in about 3 months (around 10/27/2023).  Marguerita Beards, MD

## 2023-07-30 LAB — CERVICOVAGINAL ANCILLARY ONLY
Bacterial Vaginitis (gardnerella): POSITIVE — AB
Candida Glabrata: NEGATIVE
Candida Vaginitis: NEGATIVE
Comment: NEGATIVE
Comment: NEGATIVE
Comment: NEGATIVE

## 2023-07-30 MED ORDER — METRONIDAZOLE 500 MG PO TABS: 500.0000 mg | ORAL_TABLET | Freq: Two times a day (BID) | ORAL | 0 refills | Status: AC

## 2023-07-30 NOTE — Addendum Note (Signed)
Addended by: Marguerita Beards on: 07/30/2023 04:44 PM   Modules accepted: Orders

## 2023-08-05 ENCOUNTER — Encounter: Payer: BC Managed Care – PPO | Admitting: Obstetrics and Gynecology

## 2023-08-06 ENCOUNTER — Encounter: Payer: BC Managed Care – PPO | Admitting: Obstetrics and Gynecology

## 2023-08-13 ENCOUNTER — Encounter: Payer: Self-pay | Admitting: Obstetrics and Gynecology

## 2023-08-15 ENCOUNTER — Ambulatory Visit: Payer: 59 | Admitting: Obstetrics and Gynecology

## 2023-08-19 ENCOUNTER — Other Ambulatory Visit (HOSPITAL_COMMUNITY)
Admission: RE | Admit: 2023-08-19 | Discharge: 2023-08-19 | Disposition: A | Discharge status: Home / Self Care | Payer: 59 | Source: Ambulatory Visit | Attending: Obstetrics and Gynecology | Admitting: Obstetrics and Gynecology

## 2023-08-19 ENCOUNTER — Ambulatory Visit: Payer: 59 | Admitting: Obstetrics and Gynecology

## 2023-08-19 ENCOUNTER — Encounter: Payer: Self-pay | Admitting: Obstetrics and Gynecology

## 2023-08-19 VITALS — BP 115/78 | HR 74

## 2023-08-19 DIAGNOSIS — N760 Acute vaginitis: Secondary | ICD-10-CM | POA: Diagnosis not present

## 2023-08-19 DIAGNOSIS — R350 Frequency of micturition: Secondary | ICD-10-CM | POA: Diagnosis not present

## 2023-08-19 DIAGNOSIS — B9689 Other specified bacterial agents as the cause of diseases classified elsewhere: Secondary | ICD-10-CM | POA: Insufficient documentation

## 2023-08-19 DIAGNOSIS — N898 Other specified noninflammatory disorders of vagina: Secondary | ICD-10-CM

## 2023-08-19 LAB — POCT URINALYSIS DIPSTICK
Bilirubin, UA: NEGATIVE
Blood, UA: NEGATIVE
Glucose, UA: NEGATIVE
Ketones, UA: NEGATIVE
Leukocytes, UA: NEGATIVE
Nitrite, UA: NEGATIVE
Protein, UA: NEGATIVE
Spec Grav, UA: 1.025 (ref 1.010–1.025)
Urobilinogen, UA: 0.2 U/dL
pH, UA: 6.5 (ref 5.0–8.0)

## 2023-08-19 MED ORDER — ESTRADIOL 0.1 MG/GM VA CREA: TOPICAL_CREAM | VAGINAL | 11 refills | Status: AC

## 2023-08-19 NOTE — Progress Notes (Signed)
 [facility]  Date of Visit: 08/19/2023  History of Present Illness: Ms. Forrer is a 43 y.o. female scheduled today for follow up from vaginal discharge.   Surgery: s/p  s/p urethral diverticulectomy on 06/23/23   Previously had swab positive for bacterial vaginosis. Symptoms went away while taking flagyl but returned quickly after, Notices a fishy odor.   Also c/o lack of sexual desire. Tried to have sex once since the surgery and felt things were very dry. Has been on an SSRI for many years and feels depression is well controlled.   Has not yet made pelvic PT appt. She has found a pelvic PT in a chiropractic office close to her (in [place]) and will pursue treatment with them.   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   Allergies: Patient has no known allergies.   Physical Exam: BP 115/78   Pulse 74   LMP 01/31/2020    Pelvic Examination: Aptima obtained. No suture present   POC urine negative ---------------------------------------------------------  Assessment and Plan:  1. Bacterial vaginosis   2. Urinary frequency   3. Vaginal dryness      - Aptima swab obtained today. Will wait to treat until swab returns.  - For vaginal dryness, will start estrace cream 0.5g nightly for two weeks then twice a week after. We discussed etiology of sexual desire and encouraged her to try stress management and find ways to bond with her partner during the day. Discussed that if she is not seeing improvement, then she should follow up with her general gynecologist who can do testing to see if she is in  peri-menopause and discuss further treatment options.  - For SUI treatment, encouraged her to schedule with pelvic PT and reevaluate in 3 months. Handouts provided on sling and urethral bulking options.   All questions answered.    Marguerita Beards, MD

## 2023-08-20 ENCOUNTER — Encounter: Payer: Self-pay | Admitting: Obstetrics and Gynecology

## 2023-08-20 LAB — CERVICOVAGINAL ANCILLARY ONLY
Bacterial Vaginitis (gardnerella): NEGATIVE
Comment: NEGATIVE
Comment: NEGATIVE
Trichomonas: NEGATIVE

## 2023-10-27 ENCOUNTER — Ambulatory Visit: Payer: 59 | Admitting: Obstetrics and Gynecology
